# Patient Record
Sex: Female | Born: 1945 | Race: White | Hispanic: No | Marital: Married | Smoking: Former smoker
Health system: Southern US, Community
[De-identification: ages and names within clinical notes are randomized; demographics above are authoritative.]

## PROBLEM LIST (undated history)

## (undated) DIAGNOSIS — I509 Heart failure, unspecified: Secondary | ICD-10-CM

## (undated) DIAGNOSIS — C4491 Basal cell carcinoma of skin, unspecified: Secondary | ICD-10-CM

## (undated) DIAGNOSIS — D649 Anemia, unspecified: Secondary | ICD-10-CM

## (undated) DIAGNOSIS — I4891 Unspecified atrial fibrillation: Secondary | ICD-10-CM

## (undated) DIAGNOSIS — F0390 Unspecified dementia without behavioral disturbance: Secondary | ICD-10-CM

## (undated) HISTORY — PX: HIP PINNING: SHX1757

---

## 2005-12-12 ENCOUNTER — Inpatient Hospital Stay: Payer: Self-pay | Admitting: Unknown Physician Specialty

## 2005-12-12 ENCOUNTER — Other Ambulatory Visit: Payer: Self-pay

## 2019-04-18 ENCOUNTER — Encounter: Payer: Self-pay | Admitting: Anesthesiology

## 2019-04-18 ENCOUNTER — Inpatient Hospital Stay
Admission: EM | Admit: 2019-04-18 | Discharge: 2019-04-21 | DRG: 291 | Disposition: A | Discharge status: Hospice | Payer: Medicare Other | Attending: Internal Medicine | Admitting: Internal Medicine

## 2019-04-18 ENCOUNTER — Emergency Department: Payer: Medicare Other

## 2019-04-18 ENCOUNTER — Other Ambulatory Visit: Payer: Self-pay

## 2019-04-18 DIAGNOSIS — I480 Paroxysmal atrial fibrillation: Secondary | ICD-10-CM | POA: Diagnosis present

## 2019-04-18 DIAGNOSIS — D509 Iron deficiency anemia, unspecified: Secondary | ICD-10-CM | POA: Diagnosis present

## 2019-04-18 DIAGNOSIS — R778 Other specified abnormalities of plasma proteins: Secondary | ICD-10-CM | POA: Diagnosis not present

## 2019-04-18 DIAGNOSIS — J9602 Acute respiratory failure with hypercapnia: Secondary | ICD-10-CM | POA: Diagnosis present

## 2019-04-18 DIAGNOSIS — J9601 Acute respiratory failure with hypoxia: Secondary | ICD-10-CM | POA: Diagnosis present

## 2019-04-18 DIAGNOSIS — D492 Neoplasm of unspecified behavior of bone, soft tissue, and skin: Secondary | ICD-10-CM | POA: Diagnosis not present

## 2019-04-18 DIAGNOSIS — I509 Heart failure, unspecified: Secondary | ICD-10-CM | POA: Diagnosis not present

## 2019-04-18 DIAGNOSIS — R000 Tachycardia, unspecified: Secondary | ICD-10-CM | POA: Diagnosis present

## 2019-04-18 DIAGNOSIS — Z9981 Dependence on supplemental oxygen: Secondary | ICD-10-CM

## 2019-04-18 DIAGNOSIS — E43 Unspecified severe protein-calorie malnutrition: Secondary | ICD-10-CM | POA: Insufficient documentation

## 2019-04-18 DIAGNOSIS — Z681 Body mass index (BMI) 19 or less, adult: Secondary | ICD-10-CM

## 2019-04-18 DIAGNOSIS — R222 Localized swelling, mass and lump, trunk: Secondary | ICD-10-CM

## 2019-04-18 DIAGNOSIS — R0603 Acute respiratory distress: Secondary | ICD-10-CM

## 2019-04-18 DIAGNOSIS — D649 Anemia, unspecified: Secondary | ICD-10-CM | POA: Diagnosis present

## 2019-04-18 DIAGNOSIS — R0600 Dyspnea, unspecified: Secondary | ICD-10-CM

## 2019-04-18 DIAGNOSIS — J90 Pleural effusion, not elsewhere classified: Secondary | ICD-10-CM

## 2019-04-18 DIAGNOSIS — Z20822 Contact with and (suspected) exposure to covid-19: Secondary | ICD-10-CM | POA: Diagnosis present

## 2019-04-18 DIAGNOSIS — I5021 Acute systolic (congestive) heart failure: Secondary | ICD-10-CM | POA: Diagnosis present

## 2019-04-18 DIAGNOSIS — Z7189 Other specified counseling: Secondary | ICD-10-CM | POA: Diagnosis not present

## 2019-04-18 DIAGNOSIS — I4891 Unspecified atrial fibrillation: Secondary | ICD-10-CM

## 2019-04-18 DIAGNOSIS — I248 Other forms of acute ischemic heart disease: Secondary | ICD-10-CM | POA: Diagnosis present

## 2019-04-18 DIAGNOSIS — Z66 Do not resuscitate: Secondary | ICD-10-CM | POA: Diagnosis present

## 2019-04-18 DIAGNOSIS — R7989 Other specified abnormal findings of blood chemistry: Secondary | ICD-10-CM

## 2019-04-18 DIAGNOSIS — Z515 Encounter for palliative care: Secondary | ICD-10-CM

## 2019-04-18 DIAGNOSIS — Z87891 Personal history of nicotine dependence: Secondary | ICD-10-CM

## 2019-04-18 DIAGNOSIS — E876 Hypokalemia: Secondary | ICD-10-CM | POA: Diagnosis not present

## 2019-04-18 HISTORY — DX: Anemia, unspecified: D64.9

## 2019-04-18 HISTORY — DX: Heart failure, unspecified: I50.9

## 2019-04-18 LAB — CBC WITH DIFFERENTIAL/PLATELET
Abs Immature Granulocytes: 0.09 10*3/uL — ABNORMAL HIGH (ref 0.00–0.07)
Basophils Absolute: 0 10*3/uL (ref 0.0–0.1)
Basophils Relative: 0 %
Eosinophils Absolute: 0 10*3/uL (ref 0.0–0.5)
Eosinophils Relative: 0 %
HCT: 17.3 % — ABNORMAL LOW (ref 36.0–46.0)
Hemoglobin: 4.6 g/dL — CL (ref 12.0–15.0)
Immature Granulocytes: 1 %
Lymphocytes Relative: 7 %
Lymphs Abs: 0.7 10*3/uL (ref 0.7–4.0)
MCH: 17.5 pg — ABNORMAL LOW (ref 26.0–34.0)
MCHC: 26.6 g/dL — ABNORMAL LOW (ref 30.0–36.0)
MCV: 65.8 fL — ABNORMAL LOW (ref 80.0–100.0)
Monocytes Absolute: 0.5 10*3/uL (ref 0.1–1.0)
Monocytes Relative: 6 %
Neutro Abs: 8 10*3/uL — ABNORMAL HIGH (ref 1.7–7.7)
Neutrophils Relative %: 86 %
Platelets: 345 10*3/uL (ref 150–400)
RBC: 2.63 MIL/uL — ABNORMAL LOW (ref 3.87–5.11)
RDW: 18.6 % — ABNORMAL HIGH (ref 11.5–15.5)
Smear Review: NORMAL
WBC: 9.3 10*3/uL (ref 4.0–10.5)
nRBC: 1.9 % — ABNORMAL HIGH (ref 0.0–0.2)

## 2019-04-18 LAB — PROTIME-INR
INR: 1.6 — ABNORMAL HIGH (ref 0.8–1.2)
Prothrombin Time: 18.6 seconds — ABNORMAL HIGH (ref 11.4–15.2)

## 2019-04-18 LAB — COMPREHENSIVE METABOLIC PANEL
ALT: 38 U/L (ref 0–44)
AST: 66 U/L — ABNORMAL HIGH (ref 15–41)
Albumin: 2.9 g/dL — ABNORMAL LOW (ref 3.5–5.0)
Alkaline Phosphatase: 58 U/L (ref 38–126)
Anion gap: 18 — ABNORMAL HIGH (ref 5–15)
BUN: 25 mg/dL — ABNORMAL HIGH (ref 8–23)
CO2: 14 mmol/L — ABNORMAL LOW (ref 22–32)
Calcium: 8.6 mg/dL — ABNORMAL LOW (ref 8.9–10.3)
Chloride: 105 mmol/L (ref 98–111)
Creatinine, Ser: 0.97 mg/dL (ref 0.44–1.00)
GFR calc Af Amer: >60 mL/min (ref >60)
GFR calc non Af Amer: 57 mL/min — ABNORMAL LOW (ref >60)
Glucose, Bld: 139 mg/dL — ABNORMAL HIGH (ref 70–99)
Potassium: 3.9 mmol/L (ref 3.5–5.1)
Sodium: 137 mmol/L (ref 135–145)
Total Bilirubin: 1 mg/dL (ref 0.3–1.2)
Total Protein: 7.6 g/dL (ref 6.5–8.1)

## 2019-04-18 LAB — FERRITIN: Ferritin: 5 ng/mL — ABNORMAL LOW (ref 11–307)

## 2019-04-18 LAB — PREPARE RBC (CROSSMATCH)

## 2019-04-18 LAB — RESPIRATORY PANEL BY RT PCR (FLU A&B, COVID)
Influenza A by PCR: NEGATIVE
Influenza B by PCR: NEGATIVE
SARS Coronavirus 2 by RT PCR: NEGATIVE

## 2019-04-18 LAB — IRON AND TIBC
Iron: 10 ug/dL — ABNORMAL LOW (ref 28–170)
Saturation Ratios: 2 % — ABNORMAL LOW (ref 10.4–31.8)
TIBC: 430 ug/dL (ref 250–450)
UIBC: 420 ug/dL

## 2019-04-18 LAB — PROCALCITONIN: Procalcitonin: <0.1 ng/mL

## 2019-04-18 LAB — BRAIN NATRIURETIC PEPTIDE: B Natriuretic Peptide: 1431 pg/mL — ABNORMAL HIGH (ref 0.0–100.0)

## 2019-04-18 LAB — TSH: TSH: 1.744 u[IU]/mL (ref 0.350–4.500)

## 2019-04-18 LAB — POC SARS CORONAVIRUS 2 AG: SARS Coronavirus 2 Ag: NEGATIVE

## 2019-04-18 LAB — MAGNESIUM: Magnesium: 2.2 mg/dL (ref 1.7–2.4)

## 2019-04-18 LAB — VITAMIN B12: Vitamin B-12: 497 pg/mL (ref 180–914)

## 2019-04-18 LAB — ABO/RH: ABO/RH(D): A POS

## 2019-04-18 MED ORDER — CEFAZOLIN (ANCEF) 1 G IV SOLR
1.0000 g | INTRAVENOUS | Status: DC
  Filled 2019-04-18: qty 1

## 2019-04-18 MED ORDER — SODIUM CHLORIDE 0.9% FLUSH
3.0000 mL | Freq: Two times a day (BID) | INTRAVENOUS | Status: DC
  Administered 2019-04-18 – 2019-04-21 (×6): 3 mL via INTRAVENOUS

## 2019-04-18 MED ORDER — SODIUM CHLORIDE 0.9% FLUSH: 3.0000 mL | INTRAVENOUS | Status: DC | PRN

## 2019-04-18 MED ORDER — ONDANSETRON HCL 4 MG/2ML IJ SOLN: 4.0000 mg | Freq: Four times a day (QID) | INTRAMUSCULAR | Status: DC | PRN

## 2019-04-18 MED ORDER — FUROSEMIDE 10 MG/ML IJ SOLN
20.0000 mg | Freq: Once | INTRAMUSCULAR | Status: AC
  Administered 2019-04-18: 20 mg via INTRAVENOUS
  Filled 2019-04-18: qty 4

## 2019-04-18 MED ORDER — FUROSEMIDE 10 MG/ML IJ SOLN: 20.0000 mg | Freq: Two times a day (BID) | INTRAMUSCULAR | Status: DC

## 2019-04-18 MED ORDER — FUROSEMIDE 10 MG/ML IJ SOLN
INTRAMUSCULAR | Status: AC
  Administered 2019-04-18: 20 mg via INTRAVENOUS
  Filled 2019-04-18: qty 2

## 2019-04-18 MED ORDER — IOHEXOL 350 MG/ML SOLN
75.0000 mL | Freq: Once | INTRAVENOUS | Status: AC | PRN
  Administered 2019-04-18: 75 mL via INTRAVENOUS

## 2019-04-18 MED ORDER — SODIUM CHLORIDE 0.9 % IV SOLN: 250.0000 mL | INTRAVENOUS | Status: DC | PRN

## 2019-04-18 MED ORDER — ACETAMINOPHEN 325 MG PO TABS
650.0000 mg | ORAL_TABLET | ORAL | Status: DC | PRN
  Administered 2019-04-20: 650 mg via ORAL

## 2019-04-18 MED ORDER — SODIUM CHLORIDE 0.9 % IV SOLN
10.0000 mL/h | Freq: Once | INTRAVENOUS | Status: AC
  Administered 2019-04-18: 10 mL/h via INTRAVENOUS

## 2019-04-18 MED ORDER — CEFAZOLIN SODIUM-DEXTROSE 1-4 GM/50ML-% IV SOLN: 1.0000 g | INTRAVENOUS | Status: AC

## 2019-04-18 NOTE — ED Provider Notes (Signed)
[facility] Emergency Department Provider Note  ____________________________________________  Time seen: Approximately 3:21 PM  I have reviewed the triage vital signs and the nursing notes.   HISTORY  Chief Complaint Shortness of Breath    HPI Kimberly Cantu is a 74 y.o. female with no known past medical history who comes the ED complaining of shortness of breath, gradual onset for the past week.  Noted to have a room air oxygen saturation of 79% by EMS.  Patient denies chest pain body aches fevers chills or sweats or sick contacts.  Symptoms are constant, no aggravating or alleviating factors.      History reviewed. No pertinent past medical history.   Patient Active Problem List   Diagnosis Date Noted  . Severe anemia 04/18/2019     History reviewed. No pertinent surgical history.   Prior to Admission medications   Not on File  None   Allergies Patient has no allergy information on record.   No family history on file.  Social History Social History   Tobacco Use  . Smoking status: Former Research scientist (life sciences)  . Smokeless tobacco: Never Used  Substance Use Topics  . Alcohol use: Not on file  . Drug use: Not on file    Review of Systems  Constitutional:   No fever or chills.  ENT:   No sore throat. No rhinorrhea. Cardiovascular:   No chest pain or syncope. Respiratory: Positive shortness of breath as above without cough. Gastrointestinal:   Negative for abdominal pain, vomiting and diarrhea.  Musculoskeletal:   Negative for focal pain or swelling All other systems reviewed and are negative except as documented above in ROS and HPI.  ____________________________________________   PHYSICAL EXAM:  VITAL SIGNS: ED Triage Vitals  Enc Vitals Group     BP 04/18/19 1300 (!) 128/55     Pulse Rate 04/18/19 1300 (!) 105     Resp 04/18/19 1300 (!) 25     Temp 04/18/19 1443 97.7 F (36.5 C)     Temp Source 04/18/19 1443 Oral     SpO2  04/18/19 1300 99 %     Weight 04/18/19 1245 115 lb (52.2 kg)     Height 04/18/19 1245 5\' 3"  (1.6 m)     Head Circumference --      Peak Flow --      Pain Score --      Pain Loc --      Pain Edu? --      Excl. in [place]? --     Vital signs reviewed, nursing assessments reviewed.   Constitutional:   Alert and oriented.  Ill-appearing.  Very pale and emaciated Eyes:   Conjunctivae are pale. EOMI. PERRL. ENT      Head:   Normocephalic and atraumatic.      Nose:   Wearing a mask.      Mouth/Throat:   Wearing a mask.      Neck:   No meningismus. Full ROM. Hematological/Lymphatic/Immunilogical:   No cervical lymphadenopathy. Cardiovascular:   Tachycardia heart rate 105. Symmetric bilateral radial and DP pulses.  No murmurs. Cap refill less than 2 seconds. Respiratory:   Tachypnea, diminished breath sounds bilateral bases, right greater than left Gastrointestinal:   Soft and nontender. Non distended. There is no CVA tenderness.  No rebound, rigidity, or guarding.  Musculoskeletal:   Normal range of motion in all extremities. No joint effusions.  No lower extremity tenderness.  No edema.  Right upper chest wall has a 3  x 5 cm lobulated soft tissue mass with prominent vasculature concerning for malignancy Neurologic:   Normal speech and language.  Motor grossly intact. No acute focal neurologic deficits are appreciated.  Skin:    Skin is warm, dry and intact. No rash noted.  No petechiae, purpura, or bullae.  ____________________________________________    LABS (pertinent positives/negatives) (all labs ordered are listed, but only abnormal results are displayed) Labs Reviewed  COMPREHENSIVE METABOLIC PANEL - Abnormal; Notable for the following components:      Result Value   CO2 14 (*)    Glucose, Bld 139 (*)    BUN 25 (*)    Calcium 8.6 (*)    Albumin 2.9 (*)    AST 66 (*)    GFR calc non Af Amer 57 (*)    Anion gap 18 (*)    All other components within normal limits  BRAIN  NATRIURETIC PEPTIDE - Abnormal; Notable for the following components:   B Natriuretic Peptide 1,431.0 (*)    All other components within normal limits  CBC WITH DIFFERENTIAL/PLATELET - Abnormal; Notable for the following components:   RBC 2.63 (*)    Hemoglobin 4.6 (*)    HCT 17.3 (*)    MCV 65.8 (*)    MCH 17.5 (*)    MCHC 26.6 (*)    RDW 18.6 (*)    nRBC 1.9 (*)    Neutro Abs 8.0 (*)    Abs Immature Granulocytes 0.09 (*)    All other components within normal limits  PROTIME-INR - Abnormal; Notable for the following components:   Prothrombin Time 18.6 (*)    INR 1.6 (*)    All other components within normal limits  RESPIRATORY PANEL BY RT PCR (FLU A&B, COVID)  PROCALCITONIN  POC SARS CORONAVIRUS 2 AG -  ED  POC SARS CORONAVIRUS 2 AG  TYPE AND SCREEN  PREPARE RBC (CROSSMATCH)  ABO/RH   ____________________________________________   EKG  Interpreted by me Sinus tachycardia rate 109, normal axis and intervals.  Poor R wave progression.  Normal ST segments.  Lateral T wave inversions, likely demand ischemia.  ____________________________________________    RADIOLOGY  CT Angio Chest PE W and/or Wo Contrast  Result Date: 04/18/2019 CLINICAL DATA:  Shortness of breath, negative COVID-19 test in the ER today EXAM: CT ANGIOGRAPHY CHEST WITH CONTRAST TECHNIQUE: Multidetector CT imaging of the chest was performed using the standard protocol during bolus administration of intravenous contrast. Multiplanar CT image reconstructions and MIPs were obtained to evaluate the vascular anatomy. CONTRAST:  80mL OMNIPAQUE IOHEXOL 350 MG/ML SOLN IV COMPARISON:  None FINDINGS: Cardiovascular: Atherosclerotic calcifications of thoracic aorta, coronary arteries, and proximal great vessels. Aorta normal caliber. No aneurysm or dissection. Pulmonary arteries adequately opacified and patent. No evidence of pulmonary embolism. Heart unremarkable. No pericardial effusion. Mediastinum/Nodes: Esophagus  unremarkable. Base of cervical region normal appearance. Enlarged precarinal lymph node 13 mm short axis. Probable enlarged subcarinal node 15 mm short axis. Few additional scattered upper normal sized mediastinal lymph nodes. Abnormal soft tissue at base of the RIGHT supraclavicular region, likely superficial lobulated mass, 4.5 x 2.6 x 2.4 cm image 5. Lungs/Pleura: Extensive BILATERAL mixed interstitial and airspace infiltrates which could represent pulmonary edema or multifocal infection. BILATERAL moderate pleural effusions. Mild compressive atelectasis of the posterior lower lobes. No pneumothorax or discrete pulmonary mass. Upper Abdomen: Visualized upper abdomen unremarkable Musculoskeletal: Diffuse osseous demineralization. Superior endplate compression deformity of L1 vertebral body. Review of the MIP images confirms the above findings. IMPRESSION:  No evidence of pulmonary embolism. Scattered atherosclerotic calcifications including coronary arteries. Extensive BILATERAL mixed interstitial and airspace infiltrates question multifocal pneumonia or pulmonary edema. Mildly prominent lymph nodes in mediastinum, nonspecific. BILATERAL moderate-sized pleural effusions. Lobulated superficial soft tissue mass superficially at the inferior RIGHT supraclavicular region, recommend correlation with physical exam to exclude tumor. Aortic Atherosclerosis (ICD10-I70.0). Findings called to Dr. Joni Fears on 04/18/2019 at 1501 hours. Electronically Signed   By: Lavonia Dana M.D.   On: 04/18/2019 15:03   DG Chest Portable 1 View  Result Date: 04/18/2019 CLINICAL DATA:  Shortness of breath. Right upper chest mass. Sudden onset of shortness of breath. EXAM: PORTABLE CHEST 1 VIEW COMPARISON:  12/12/2005 FINDINGS: Artifact overlies the chest. Heart size is normal. There is abnormal interstitial and alveolar pulmonary density in a bat wing configuration suggesting acute interstitial and alveolar edema. Bronchopneumonia could have  this appearance. Possible sub pulmonic pleural effusions. No discernible discrete mass lesion. No acute bone finding. IMPRESSION: Bilateral interstitial and alveolar pulmonary density in a perihilar pattern suggesting acute pulmonary edema. Bronchopneumonia could have this appearance. Suspicion of sub pulmonic pleural fluid. Electronically Signed   By: Nelson Chimes M.D.   On: 04/18/2019 13:16    ____________________________________________   PROCEDURES .Critical Care Performed by: Carrie Mew, MD Authorized by: Carrie Mew, MD   Critical care provider statement:    Critical care time (minutes):  35   Critical care time was exclusive of:  Separately billable procedures and treating other patients   Critical care was necessary to treat or prevent imminent or life-threatening deterioration of the following conditions:  Respiratory failure, shock and cardiac failure   Critical care was time spent personally by me on the following activities:  Development of treatment plan with patient or surrogate, discussions with consultants, evaluation of patient's response to treatment, examination of patient, obtaining history from patient or surrogate, ordering and performing treatments and interventions, ordering and review of laboratory studies, ordering and review of radiographic studies, pulse oximetry, re-evaluation of patient's condition and review of old charts    ____________________________________________  DIFFERENTIAL DIAGNOSIS   Lung cancer, breast cancer, pulmonary realism, pneumonia, COVID-19, pulmonary edema, new onset heart failure, pleural effusion, pneumothorax  CLINICAL IMPRESSION / ASSESSMENT AND PLAN / ED COURSE  Medications ordered in the ED: Medications  0.9 %  sodium chloride infusion (has no administration in time range)  furosemide (LASIX) injection 20 mg (has no administration in time range)  iohexol (OMNIPAQUE) 350 MG/ML injection 75 mL (75 mLs Intravenous  Contrast Given 04/18/19 1433)    Pertinent labs & imaging results that were available during my care of the patient were reviewed by me and considered in my medical decision making (see chart for details).  Ople Ishibashi was evaluated in Emergency Department on 04/18/2019 for the symptoms described in the history of present illness. She was evaluated in the context of the global COVID-19 pandemic, which necessitated consideration that the patient might be at risk for infection with the SARS-CoV-2 virus that causes COVID-19. Institutional protocols and algorithms that pertain to the evaluation of patients at risk for COVID-19 are in a state of rapid change based on information released by regulatory bodies including the CDC and federal and state organizations. These policies and algorithms were followed during the patient's care in the ED.   Patient presents with shortness of breath, hypoxic to 79% on room air, stabilized on nasal cannula oxygen.  Will get labs chest x-ray.  Given the prominent tumor on the chest wall,  will need to obtain a CT scan to rule out PE as well.  Clinical Course as of Apr 18 1519  Wed Apr 18, 2019  1317 Chest x-ray shows hazy infiltrates throughout bilateral lungs, much worse on the right.  Also has a soft tissue mass on the chest wall concerning for undiagnosed malignancy especially given her pronounced pallor.  Follow-up labs, will obtain CT angiogram of the chest to evaluate for PE.   [PS]  1402 Patient consents to blood transfusion.   [PS]    Clinical Course User Index [PS] Carrie Mew, MD     ----------------------------------------- 3:26 PM on 04/18/2019 -----------------------------------------  Discussed with radiology, notes no PE, no evidence of a primary lung or breast cancer.  Large amount of edema, effusion.  Discussed with hospitalist who requests IV Lasix in between units of blood.  They suggest that the severe anemia may have precipitated heart  failure resulting in the respiratory failure.  Rapid POC Covid test negative, 2-hour PCR pending. ____________________________________________   FINAL CLINICAL IMPRESSION(S) / ED DIAGNOSES    Final diagnoses:  Acute heart failure, unspecified heart failure type ([place])  Acute respiratory failure with hypoxia [facility])  Soft tissue tumor     ED Discharge Orders    None      Portions of this note were generated with dragon dictation software. Dictation errors may occur despite best attempts at proofreading.   Carrie Mew, MD 04/18/19 1527

## 2019-04-18 NOTE — Consult Note (Signed)
Reason for Consult: Fungating skin mass Referring Physician: Dr. Mal Misty, hospital medicine.  Kimberly Cantu is an 74 y.o. female.  HPI: She presented to the emergency department today with shortness of breath.  She was found to be profoundly anemic with a hemoglobin of 4.  She also had a room air pulse oximetry level of 79%.  Due to her dyspnea and hypoxia, she underwent a PE protocol CT scan that was negative.  She does have bilateral infiltrates and effusions, however her white blood cell count is normal, as is her pro calcitonin.  The current admitting diagnosis is that of congestive heart failure secondary to profound anemia.  During the course of her evaluation in the emergency department, she was found to have a fungating mass on her right clavicle.  Patient states that this has been present since at least 2007 and she was told, at that time, that it was benign.  She reports that she has not seen a physician in approximately 5 years.  Since 2007, the mass has grown significantly.  Based upon its appearance, general surgery has been consulted to evaluate for biopsy in the setting of potential malignancy.  During the course of my discussion with the patient, she stated that it usually does not look as bad, but that she rolled over on it in her sleep last night.  She states this multiple times during the course of my interview.  She reports that it is nontender and that it has never bled or used the way it is currently.  When I posit that it may be a basal cell carcinoma, she states that her father did have basal cell carcinomas on his face.  She has a second lesion on the apex of her right shoulder and she says that this is what the larger mass looked like, back when it was first identified.  She denies ever having had surgery in the past.  She does give a history of multiple blistering sunburns in her childhood.  Her natural hair color is blonde and she does have blue eyes.  Although she is pale from  anemia, she appears to have skin type I.  She is currently receiving a blood transfusion.  History reviewed. No pertinent past medical history.  History reviewed. No pertinent surgical history.  No family history on file.  Social History:  reports that she has quit smoking. She has never used smokeless tobacco. No history on file for alcohol and drug.  Allergies: No Known Allergies  Medications: I have reviewed the patient's current medications.  Results for orders placed or performed during the hospital encounter of 04/18/19 (from the past 48 hour(s))  Comprehensive metabolic panel     Status: Abnormal   Collection Time: 04/18/19 12:56 PM  Result Value Ref Range   Sodium 137 135 - 145 mmol/L   Potassium 3.9 3.5 - 5.1 mmol/L   Chloride 105 98 - 111 mmol/L   CO2 14 (L) 22 - 32 mmol/L   Glucose, Bld 139 (H) 70 - 99 mg/dL   BUN 25 (H) 8 - 23 mg/dL   Creatinine, Ser 0.97 0.44 - 1.00 mg/dL   Calcium 8.6 (L) 8.9 - 10.3 mg/dL   Total Protein 7.6 6.5 - 8.1 g/dL   Albumin 2.9 (L) 3.5 - 5.0 g/dL   AST 66 (H) 15 - 41 U/L   ALT 38 0 - 44 U/L    Comment: RESULT CONFIRMED BY MANUAL DILUTION SDR/KLW   Alkaline Phosphatase 58 38 - 126  U/L   Total Bilirubin 1.0 0.3 - 1.2 mg/dL   GFR calc non Af Amer 57 (L) >60 mL/min   GFR calc Af Amer >60 >60 mL/min   Anion gap 18 (H) 5 - 15    Comment: Performed at [facility], [address] [place], [place] [address]  Brain natriuretic peptide     Status: Abnormal   Collection Time: 04/18/19 12:56 PM  Result Value Ref Range   B Natriuretic Peptide 1,431.0 (H) 0.0 - 100.0 pg/mL    Comment: Performed at [facility], [address] [place], [place] [address]  CBC with Differential     Status: Abnormal   Collection Time: 04/18/19 12:56 PM  Result Value Ref Range   WBC 9.3 4.0 - 10.5 K/uL   RBC 2.63 (L) 3.87 - 5.11 MIL/uL   Hemoglobin 4.6 (LL) 12.0 - 15.0 g/dL    Comment: Reticulocyte Hemoglobin testing may be clinically  indicated, consider ordering this additional test [id number] THIS CRITICAL RESULT HAS VERIFIED AND BEEN CALLED TO KATIE FERGUSON BY MARISA CARVER ON 01 06 2021 AT 1320, AND HAS BEEN READ BACK.     HCT 17.3 (L) 36.0 - 46.0 %   MCV 65.8 (L) 80.0 - 100.0 fL   MCH 17.5 (L) 26.0 - 34.0 pg   MCHC 26.6 (L) 30.0 - 36.0 g/dL   RDW 18.6 (H) 11.5 - 15.5 %   Platelets 345 150 - 400 K/uL   nRBC 1.9 (H) 0.0 - 0.2 %   Neutrophils Relative % 86 %   Neutro Abs 8.0 (H) 1.7 - 7.7 K/uL   Lymphocytes Relative 7 %   Lymphs Abs 0.7 0.7 - 4.0 K/uL   Monocytes Relative 6 %   Monocytes Absolute 0.5 0.1 - 1.0 K/uL   Eosinophils Relative 0 %   Eosinophils Absolute 0.0 0.0 - 0.5 K/uL   Basophils Relative 0 %   Basophils Absolute 0.0 0.0 - 0.1 K/uL   WBC Morphology MORPHOLOGY UNREMARKABLE    RBC Morphology MIXED FIELD POPULATION    Smear Review Normal platelet morphology    Immature Granulocytes 1 %   Abs Immature Granulocytes 0.09 (H) 0.00 - 0.07 K/uL    Comment: Performed at [facility], [address] [place], [place] [address]  Protime-INR     Status: Abnormal   Collection Time: 04/18/19 12:56 PM  Result Value Ref Range   Prothrombin Time 18.6 (H) 11.4 - 15.2 seconds   INR 1.6 (H) 0.8 - 1.2    Comment: (NOTE) INR goal varies based on device and disease states. Performed at [facility], [address] [place], [place] [address]   Type and screen [place]     Status: None (Preliminary result)   Collection Time: 04/18/19 12:56 PM  Result Value Ref Range   ABO/RH(D) A POS    Antibody Screen NEG    Sample Expiration 04/21/2019,2359    Unit Number [id number]    Blood Component Type RED CELLS,LR    Unit division 00    Status of Unit ISSUED    Transfusion Status OK TO TRANSFUSE    Crossmatch Result      Compatible Performed at [facility] [address] [place], [place] [address]    Unit Number [id number]    Blood Component Type RED  CELLS,LR    Unit division 00    Status of Unit ALLOCATED    Transfusion Status OK TO TRANSFUSE    Crossmatch  Result Compatible   Procalcitonin - Baseline     Status: None   Collection Time: 04/18/19 12:56 PM  Result Value Ref Range   Procalcitonin <0.10 ng/mL    Comment:        Interpretation: PCT (Procalcitonin) <= 0.5 ng/mL: Systemic infection (sepsis) is not likely. Local bacterial infection is possible. (NOTE)       Sepsis PCT Algorithm           Lower Respiratory Tract                                      Infection PCT Algorithm    ----------------------------     ----------------------------         PCT < 0.25 ng/mL                PCT < 0.10 ng/mL         Strongly encourage             Strongly discourage   discontinuation of antibiotics    initiation of antibiotics    ----------------------------     -----------------------------       PCT 0.25 - 0.50 ng/mL            PCT 0.10 - 0.25 ng/mL               OR       >80% decrease in PCT            Discourage initiation of                                            antibiotics      Encourage discontinuation           of antibiotics    ----------------------------     -----------------------------         PCT >= 0.50 ng/mL              PCT 0.26 - 0.50 ng/mL               AND        <80% decrease in PCT             Encourage initiation of                                             antibiotics       Encourage continuation           of antibiotics    ----------------------------     -----------------------------        PCT >= 0.50 ng/mL                  PCT > 0.50 ng/mL               AND         increase in PCT                  Strongly encourage  initiation of antibiotics    Strongly encourage escalation           of antibiotics                                     -----------------------------                                           PCT <= 0.25 ng/mL                                                  OR                                        > 80% decrease in PCT                                     Discontinue / Do not initiate                                             antibiotics Performed at [facility], [address] [place], [place] [address]   Prepare RBC     Status: None   Collection Time: 04/18/19  1:29 PM  Result Value Ref Range   Order Confirmation      ORDER PROCESSED BY BLOOD BANK Performed at [facility], [address] [place], [place] [address]   ABO/Rh     Status: None   Collection Time: 04/18/19  1:43 PM  Result Value Ref Range   ABO/RH(D)      A POS Performed at [facility], [address] [place], [place] [address]   POC SARS Coronavirus 2 Ag     Status: None   Collection Time: 04/18/19  1:44 PM  Result Value Ref Range   SARS Coronavirus 2 Ag NEGATIVE NEGATIVE    Comment: (NOTE) SARS-CoV-2 antigen NOT DETECTED.  Negative results are presumptive.  Negative results do not preclude SARS-CoV-2 infection and should not be used as the sole basis for treatment or other patient management decisions, including infection  control decisions, particularly in the presence of clinical signs and  symptoms consistent with COVID-19, or in those who have been in contact with the virus.  Negative results must be combined with clinical observations, patient history, and epidemiological information. The expected result is Negative. Fact Sheet for Patients: PodPark.tn Fact Sheet for Healthcare Providers: GiftContent.is This test is not yet approved or cleared by the [place] FDA and  has been authorized for detection and/or diagnosis of SARS-CoV-2 by FDA under an Emergency Use Authorization (EUA).  This EUA will remain in effect (meaning this test can be used) for the duration of  the COVID-19 de claration under Section 564(b)(1) of the Act, 21 U.S.C. section  360bbb-3(b)(1), unless the authorization is terminated or revoked sooner.   Respiratory Panel by RT PCR (  Flu A&B, Covid) - Nasopharyngeal Swab     Status: None   Collection Time: 04/18/19  2:00 PM   Specimen: Nasopharyngeal Swab  Result Value Ref Range   SARS Coronavirus 2 by RT PCR NEGATIVE NEGATIVE    Comment: (NOTE) SARS-CoV-2 target nucleic acids are NOT DETECTED. The SARS-CoV-2 RNA is generally detectable in upper respiratoy specimens during the acute phase of infection. The lowest concentration of SARS-CoV-2 viral copies this assay can detect is 131 copies/mL. A negative result does not preclude SARS-Cov-2 infection and should not be used as the sole basis for treatment or other patient management decisions. A negative result may occur with  improper specimen collection/handling, submission of specimen other than nasopharyngeal swab, presence of viral mutation(s) within the areas targeted by this assay, and inadequate number of viral copies (<131 copies/mL). A negative result must be combined with clinical observations, patient history, and epidemiological information. The expected result is Negative. Fact Sheet for Patients:  PinkCheek.be Fact Sheet for Healthcare Providers:  GravelBags.it This test is not yet ap proved or cleared by the [place] FDA and  has been authorized for detection and/or diagnosis of SARS-CoV-2 by FDA under an Emergency Use Authorization (EUA). This EUA will remain  in effect (meaning this test can be used) for the duration of the COVID-19 declaration under Section 564(b)(1) of the Act, 21 U.S.C. section 360bbb-3(b)(1), unless the authorization is terminated or revoked sooner.    Influenza A by PCR NEGATIVE NEGATIVE   Influenza B by PCR NEGATIVE NEGATIVE    Comment: (NOTE) The Xpert Xpress SARS-CoV-2/FLU/RSV assay is intended as an aid in  the diagnosis of influenza from Nasopharyngeal  swab specimens and  should not be used as a sole basis for treatment. Nasal washings and  aspirates are unacceptable for Xpert Xpress SARS-CoV-2/FLU/RSV  testing. Fact Sheet for Patients: PinkCheek.be Fact Sheet for Healthcare Providers: GravelBags.it This test is not yet approved or cleared by the [place] FDA and  has been authorized for detection and/or diagnosis of SARS-CoV-2 by  FDA under an Emergency Use Authorization (EUA). This EUA will remain  in effect (meaning this test can be used) for the duration of the  Covid-19 declaration under Section 564(b)(1) of the Act, 21  U.S.C. section 360bbb-3(b)(1), unless the authorization is  terminated or revoked. Performed at [facility], [place], [place] [address]     CT Angio Chest PE W and/or Wo Contrast  Result Date: 04/18/2019 CLINICAL DATA:  Shortness of breath, negative COVID-19 test in the ER today EXAM: CT ANGIOGRAPHY CHEST WITH CONTRAST TECHNIQUE: Multidetector CT imaging of the chest was performed using the standard protocol during bolus administration of intravenous contrast. Multiplanar CT image reconstructions and MIPs were obtained to evaluate the vascular anatomy. CONTRAST:  36mL OMNIPAQUE IOHEXOL 350 MG/ML SOLN IV COMPARISON:  None FINDINGS: Cardiovascular: Atherosclerotic calcifications of thoracic aorta, coronary arteries, and proximal great vessels. Aorta normal caliber. No aneurysm or dissection. Pulmonary arteries adequately opacified and patent. No evidence of pulmonary embolism. Heart unremarkable. No pericardial effusion. Mediastinum/Nodes: Esophagus unremarkable. Base of cervical region normal appearance. Enlarged precarinal lymph node 13 mm short axis. Probable enlarged subcarinal node 15 mm short axis. Few additional scattered upper normal sized mediastinal lymph nodes. Abnormal soft tissue at base of the RIGHT supraclavicular region,  likely superficial lobulated mass, 4.5 x 2.6 x 2.4 cm image 5. Lungs/Pleura: Extensive BILATERAL mixed interstitial and airspace infiltrates which could represent pulmonary edema or multifocal infection. BILATERAL moderate pleural  effusions. Mild compressive atelectasis of the posterior lower lobes. No pneumothorax or discrete pulmonary mass. Upper Abdomen: Visualized upper abdomen unremarkable Musculoskeletal: Diffuse osseous demineralization. Superior endplate compression deformity of L1 vertebral body. Review of the MIP images confirms the above findings. IMPRESSION: No evidence of pulmonary embolism. Scattered atherosclerotic calcifications including coronary arteries. Extensive BILATERAL mixed interstitial and airspace infiltrates question multifocal pneumonia or pulmonary edema. Mildly prominent lymph nodes in mediastinum, nonspecific. BILATERAL moderate-sized pleural effusions. Lobulated superficial soft tissue mass superficially at the inferior RIGHT supraclavicular region, recommend correlation with physical exam to exclude tumor. Aortic Atherosclerosis (ICD10-I70.0). Findings called to Dr. Joni Fears on 04/18/2019 at 1501 hours. Electronically Signed   By: Lavonia Dana M.D.   On: 04/18/2019 15:03   DG Chest Portable 1 View  Result Date: 04/18/2019 CLINICAL DATA:  Shortness of breath. Right upper chest mass. Sudden onset of shortness of breath. EXAM: PORTABLE CHEST 1 VIEW COMPARISON:  12/12/2005 FINDINGS: Artifact overlies the chest. Heart size is normal. There is abnormal interstitial and alveolar pulmonary density in a bat wing configuration suggesting acute interstitial and alveolar edema. Bronchopneumonia could have this appearance. Possible sub pulmonic pleural effusions. No discernible discrete mass lesion. No acute bone finding. IMPRESSION: Bilateral interstitial and alveolar pulmonary density in a perihilar pattern suggesting acute pulmonary edema. Bronchopneumonia could have this appearance.  Suspicion of sub pulmonic pleural fluid. Electronically Signed   By: Nelson Chimes M.D.   On: 04/18/2019 13:16    Review of Systems  All other systems reviewed and are negative. Blood pressure (!) 117/58, pulse 98, temperature 97.8 F (36.6 C), temperature source Oral, resp. rate (!) 22, height 5\' 3"  (1.6 m), weight 52.2 kg, SpO2 100 %. Physical Exam  Constitutional: She is oriented to person, place, and time. She appears well-developed. No distress.  HENT:  Head: Normocephalic and atraumatic.  Mouth and nose are covered with a mask, secondary to COVID-19 precautions  Eyes: Right eye exhibits no discharge. Left eye exhibits no discharge.  Pale conjunctivae  Neck: No tracheal deviation present.  Cardiovascular: Normal rate and regular rhythm.  Respiratory: Effort normal. No stridor. No respiratory distress.  On supplemental oxygen  GI: Soft.  Genitourinary:    Genitourinary Comments: Deferred   Musculoskeletal:        General: No deformity or edema.  Neurological: She is alert and oriented to person, place, and time.  Skin: Lesion noted.     There is a large, fungating mass abutting the right clavicle.  It is fixed and nonmobile.  It is nontender.  It appears quite vascular.  There are pearly edges present.  The lesion appears to have split and is oozing serosanguineous fluid.  There is no odor or any purulent drainage identified.  There is a second small lesion just lateral, on the patient's shoulder.  It has a scab-like appearance.  Psychiatric: She has a normal mood and affect.        Assessment/Plan: This is a 74 year old woman who presented to the hospital with dyspnea.  She was found to be profoundly anemic and the working diagnosis of the admitting hospitalist service is of congestive heart failure secondary to anemia.  During the course of her evaluation, she was found to have a fungating mass on her right shoulder.  CT scan of the chest did not demonstrate any pulmonary  lesions or any breast lesions to suggest either of these etiologies as a primary tumor.  On my physical examination, it appears consistent with a basal cell carcinoma  with ulceration.  Tomorrow, I plan to take her to the operating room for an incisional biopsy.  Rather than doing this bedside, I would prefer to do it in the operating room so that I have multiple options available for hemostasis.  I do not think the entire lesion can be removed without a more complex tissue rearrangement or skin graft.  The patient is extremely thin and the lesion is closely approximated to her clavicle.  I think, that if we get a diagnosis then further management can be pursued on an outpatient basis.  I discussed the risks of the operation with her.  These include, but are not limited to, bleeding, infection, nondiagnostic sample, need for additional surgery and/or treatment.  She is willing to accept these risks and we will proceed to the operating room tomorrow.  Fredirick Maudlin 04/18/2019, 4:50 PM

## 2019-04-18 NOTE — Anesthesia Preprocedure Evaluation (Addendum)
Anesthesia Evaluation    Airway        Dental   Pulmonary former smoker,           Cardiovascular +CHF       Neuro/Psych Dementia    GI/Hepatic   Endo/Other    Renal/GU      Musculoskeletal   Abdominal   Peds  Hematology  (+) Blood dyscrasia, anemia ,   Anesthesia Other Findings CHF due to severe anemia.  Patient being transfused today.  Reproductive/Obstetrics                          Anesthesia Physical Anesthesia Plan  ASA: III  Anesthesia Plan: MAC   Post-op Pain Management:    Induction: Intravenous  PONV Risk Score and Plan:   Airway Management Planned:   Additional Equipment:   Intra-op Plan:   Post-operative Plan:   Informed Consent:   Plan Discussed with:   Anesthesia Plan Comments:         Anesthesia Quick Evaluation

## 2019-04-18 NOTE — Progress Notes (Signed)
RN SPOKE TO DR Leone Haven . MD ORDERED CBC AT 0500 IN AM PRE-OP

## 2019-04-18 NOTE — ED Triage Notes (Addendum)
Pt here via [facility] from home. Per EMS pt was ambulating to the toilet today when she became suddenly short of breath, denies history of the same.   EMS O2 sats were 79% RA, 96% when given breathing treatment. Current O2 sats are 96% on 3L [place]. EMS states wheezes and rales present throughout.  Ems VS- 158/64, 42 HR, 26 RR, 22 etCO2.  Pt also has large area of soft tissue present on right collar bone. She states it has been present since 2007 when the doctors told her it was benign. Area is draining fluid at this time.

## 2019-04-18 NOTE — ED Notes (Signed)
Pt signed paper copy of consent for blood. Consent placed in pt cubby.

## 2019-04-18 NOTE — ED Notes (Signed)
Patient transported to CT 

## 2019-04-18 NOTE — ED Notes (Signed)
Report given to Karen RN

## 2019-04-18 NOTE — H&P (Addendum)
History and Physical:    Kimberly Cantu   [id number] DOB: 01-20-1946 DOA: 04/18/2019  Referring MD/provider: Brenton Grills, MD PCP: Patient, No Pcp Per   Patient coming from: Home  Chief Complaint: Shortness of breath  History of Present Illness:   Kimberly Cantu is a 74 y.o. female with no significant medical history who presented to the hospital with shortness of breath and generalized weakness.  Patient is unable to provide an adequate history and she still over the place with her story.  She said that last night she knew something was not right but she could not place a finger on it.  Earlier this morning, she said she felt poorly and so she called her daughter and she was brought to the emergency room for further evaluation.  She thinks she has been short of breath for some time but she is unable to provide details.  She also said people have been telling her that she looked pale for some weeks now but she never paid attention to it.  She has a mass on her right upper chest wall and she thinks that is been there for over a year and has progressively worsened.  She said that normally it does not bleed but yesterday she noticed that it bled a little when she laid on it.  She is not sure what it is but she thinks it could be cancer because her had basal cell cancer for a long time.  She does not have any cough, chest pain, dizziness, palpitations, vomiting, abdominal pain, diarrhea, bloody stools.   ED Course:  The patient was mildly tachypneic and tachycardic and was hypoxemic with oxygen saturation of 79% on room air.  She was also found to have severe anemia with a hemoglobin of 4.6, elevated BNP of 1,431.  Chest x-ray showed bilateral interstitial and alveolar infiltrates.  CTA did not show PE but there was evidence of bilateral moderate-sized pleural effusions extensive bilateral mixed interstitial and airspace infiltrates and lobulated superficial soft tissue mass superficially  at the inferior right supraclavicular region.   ROS:   ROS all other systems reviewed were negative  Past Medical History:   History reviewed. No pertinent past medical history.  Past Surgical History:   History reviewed. No pertinent surgical history.  Social History:   Social History   Socioeconomic History  . Marital status: Married    Spouse name: Not on file  . Number of children: Not on file  . Years of education: Not on file  . Highest education level: Not on file  Occupational History  . Not on file  Tobacco Use  . Smoking status: Former Research scientist (life sciences)  . Smokeless tobacco: Never Used  Substance and Sexual Activity  . Alcohol use: Not on file  . Drug use: Not on file  . Sexual activity: Not on file  Other Topics Concern  . Not on file  Social History Narrative  . Not on file   Social Determinants of Health   Financial Resource Strain:   . Difficulty of Paying Living Expenses: Not on file  Food Insecurity:   . Worried About Charity fundraiser in the Last Year: Not on file  . Ran Out of Food in the Last Year: Not on file  Transportation Needs:   . Lack of Transportation (Medical): Not on file  . Lack of Transportation (Non-Medical): Not on file  Physical Activity:   . Days of Exercise per Week: Not on file  . Minutes  of Exercise per Session: Not on file  Stress:   . Feeling of Stress : Not on file  Social Connections:   . Frequency of Communication with Friends and Family: Not on file  . Frequency of Social Gatherings with Friends and Family: Not on file  . Attends Religious Services: Not on file  . Active Member of Clubs or Organizations: Not on file  . Attends Archivist Meetings: Not on file  . Marital Status: Not on file  Intimate Partner Violence:   . Fear of Current or Ex-Partner: Not on file  . Emotionally Abused: Not on file  . Physically Abused: Not on file  . Sexually Abused: Not on file    Allergies   Patient has no known  allergies.  Family history:   No family history on file.  Current Medications:   Prior to Admission medications   Not on File    Physical Exam:   Vitals:   04/18/19 1315 04/18/19 1330 04/18/19 1443 04/18/19 1503  BP:  116/62 (!) 123/59 (!) 117/58  Pulse: (!) 101 99 (!) 104 98  Resp: (!) 26 20 (!) 24 (!) 22  Temp:   97.7 F (36.5 C) 97.8 F (36.6 C)  TempSrc:   Oral Oral  SpO2: 95% 97% 100% 100%  Weight:      Height:         Physical Exam: Blood pressure (!) 117/58, pulse 98, temperature 97.8 F (36.6 C), temperature source Oral, resp. rate (!) 22, height 5\' 3"  (1.6 m), weight 52.2 kg, SpO2 100 %. Gen: No acute distress. Head: Normocephalic, atraumatic. Eyes: Pupils equal, round and reactive to light. Extraocular movements intact.  Sclerae nonicteric.  Mouth: Moist mucous membranes Neck: Supple, no thyromegaly, no lymphadenopathy, no jugular venous distention. Chest: Lungs are clear to auscultation with good air movement. No rales, rhonchi or wheezes.  CV: Heart sounds are regular with an S1, S2. No murmurs, rubs, clicks, or gallops.  Abdomen: Soft, nontender, nondistended with normal active bowel sounds. No hepatosplenomegaly or palpable masses. Extremities: Extremities are without clubbing, or cyanosis. No edema. Pedal pulses 2+.  Skin: Warm and dry.  Fungating mass on right upper chest wall/clavicular area Neuro: Alert and oriented times 3; grossly nonfocal.  Psych: Mood and affect normal.         Data Review:    Labs: Basic Metabolic Panel: Recent Labs  Lab 04/18/19 1256  NA 137  K 3.9  CL 105  CO2 14*  GLUCOSE 139*  BUN 25*  CREATININE 0.97  CALCIUM 8.6*   Liver Function Tests: Recent Labs  Lab 04/18/19 1256  AST 66*  ALT 38  ALKPHOS 58  BILITOT 1.0  PROT 7.6  ALBUMIN 2.9*   No results for input(s): LIPASE, AMYLASE in the last 168 hours. No results for input(s): AMMONIA in the last 168 hours. CBC: Recent Labs  Lab 04/18/19 1256   WBC 9.3  NEUTROABS 8.0*  HGB 4.6*  HCT 17.3*  MCV 65.8*  PLT 345   Cardiac Enzymes: No results for input(s): CKTOTAL, CKMB, CKMBINDEX, TROPONINI in the last 168 hours.  BNP (last 3 results) No results for input(s): PROBNP in the last 8760 hours. CBG: No results for input(s): GLUCAP in the last 168 hours.  Urinalysis No results found for: COLORURINE, APPEARANCEUR, LABSPEC, PHURINE, GLUCOSEU, HGBUR, BILIRUBINUR, KETONESUR, PROTEINUR, UROBILINOGEN, NITRITE, LEUKOCYTESUR    Radiographic Studies: CT Angio Chest PE W and/or Wo Contrast  Result Date: 04/18/2019 CLINICAL DATA:  Shortness of  breath, negative COVID-19 test in the ER today EXAM: CT ANGIOGRAPHY CHEST WITH CONTRAST TECHNIQUE: Multidetector CT imaging of the chest was performed using the standard protocol during bolus administration of intravenous contrast. Multiplanar CT image reconstructions and MIPs were obtained to evaluate the vascular anatomy. CONTRAST:  35mL OMNIPAQUE IOHEXOL 350 MG/ML SOLN IV COMPARISON:  None FINDINGS: Cardiovascular: Atherosclerotic calcifications of thoracic aorta, coronary arteries, and proximal great vessels. Aorta normal caliber. No aneurysm or dissection. Pulmonary arteries adequately opacified and patent. No evidence of pulmonary embolism. Heart unremarkable. No pericardial effusion. Mediastinum/Nodes: Esophagus unremarkable. Base of cervical region normal appearance. Enlarged precarinal lymph node 13 mm short axis. Probable enlarged subcarinal node 15 mm short axis. Few additional scattered upper normal sized mediastinal lymph nodes. Abnormal soft tissue at base of the RIGHT supraclavicular region, likely superficial lobulated mass, 4.5 x 2.6 x 2.4 cm image 5. Lungs/Pleura: Extensive BILATERAL mixed interstitial and airspace infiltrates which could represent pulmonary edema or multifocal infection. BILATERAL moderate pleural effusions. Mild compressive atelectasis of the posterior lower lobes. No  pneumothorax or discrete pulmonary mass. Upper Abdomen: Visualized upper abdomen unremarkable Musculoskeletal: Diffuse osseous demineralization. Superior endplate compression deformity of L1 vertebral body. Review of the MIP images confirms the above findings. IMPRESSION: No evidence of pulmonary embolism. Scattered atherosclerotic calcifications including coronary arteries. Extensive BILATERAL mixed interstitial and airspace infiltrates question multifocal pneumonia or pulmonary edema. Mildly prominent lymph nodes in mediastinum, nonspecific. BILATERAL moderate-sized pleural effusions. Lobulated superficial soft tissue mass superficially at the inferior RIGHT supraclavicular region, recommend correlation with physical exam to exclude tumor. Aortic Atherosclerosis (ICD10-I70.0). Findings called to Dr. Joni Fears on 04/18/2019 at 1501 hours. Electronically Signed   By: Lavonia Dana M.D.   On: 04/18/2019 15:03   DG Chest Portable 1 View  Result Date: 04/18/2019 CLINICAL DATA:  Shortness of breath. Right upper chest mass. Sudden onset of shortness of breath. EXAM: PORTABLE CHEST 1 VIEW COMPARISON:  12/12/2005 FINDINGS: Artifact overlies the chest. Heart size is normal. There is abnormal interstitial and alveolar pulmonary density in a bat wing configuration suggesting acute interstitial and alveolar edema. Bronchopneumonia could have this appearance. Possible sub pulmonic pleural effusions. No discernible discrete mass lesion. No acute bone finding. IMPRESSION: Bilateral interstitial and alveolar pulmonary density in a perihilar pattern suggesting acute pulmonary edema. Bronchopneumonia could have this appearance. Suspicion of sub pulmonic pleural fluid. Electronically Signed   By: Nelson Chimes M.D.   On: 04/18/2019 13:16    EKG: Independently reviewed.  Normal sinus tachycardia, T wave inversions in inferior leads and V4 to V6   Assessment/Plan:   Active Problems:   Severe anemia   Body mass index is  20.37 kg/m.   Severe anemia: Admit to telemetry.  Transfuse with 2 units of packed red blood cells.  Monitor H&H.  Check iron studies and vitamin B12 level.  Acute hypoxemic respiratory failure: Continue oxygen via nasal cannula and taper off as able.  Acute heart failure probably from severe anemia: Treat with IV Lasix.  Monitor urine output, BMP and daily weights.  Obtain 2D echo for further evaluation.  Procalcitonin is normal, she has no fever and WBC is normal.  Doubt pneumonia at this time.  Bilateral moderate pleural effusions: This is likely from CHF.  Treat with IV Lasix for now.  Thoracentesis will be considered if pleural effusions persist despite adequate diuresis.  Fungating mass on right upper chest wall around clavicular area: Consulted general surgeon, Dr. Celine Ahr, for biopsy.    Other information:   DVT  prophylaxis: SCDs Code Status: Full code. Family Communication: Plan discussed with the patient  Disposition Plan: Possible discharge to home in 2 to 3 days Consults called: Education officer, environmental, Dr. Celine Ahr Admission status: Inpatient   The medical decision making on this patient was of high complexity and the patient is at high risk for clinical deterioration, therefore this is a level 3 visit.    Time spent 55 minutes  [facility]   How to contact the [facility] Attending or Consulting provider [place] or covering provider during after hours [place], for this patient?   1. Check the care team in [facility] and look for a) attending/consulting [facility] provider listed and b) the [facility] team listed 2. Log into [email or web address] and use [place]'s universal password to access. If you do not have the password, please contact the hospital operator. 3. Locate the [facility] provider you are looking for under [facility] and page to a number that you can be directly reached. 4. If you still have difficulty reaching the provider, please page the [facility] (Director on Call) for the  Hospitalists listed on amion for assistance.  04/18/2019, 3:51 PM

## 2019-04-19 ENCOUNTER — Encounter
Admission: EM | Disposition: A | Discharge status: Hospice | Payer: Self-pay | Source: Home / Self Care | Attending: Internal Medicine

## 2019-04-19 ENCOUNTER — Inpatient Hospital Stay: Payer: Medicare Other

## 2019-04-19 ENCOUNTER — Inpatient Hospital Stay
Admit: 2019-04-19 | Discharge: 2019-04-19 | Disposition: A | Discharge status: Home / Self Care | Payer: Medicare Other | Attending: Internal Medicine | Admitting: Internal Medicine

## 2019-04-19 ENCOUNTER — Encounter: Payer: Self-pay | Admitting: Internal Medicine

## 2019-04-19 DIAGNOSIS — R222 Localized swelling, mass and lump, trunk: Secondary | ICD-10-CM

## 2019-04-19 DIAGNOSIS — E43 Unspecified severe protein-calorie malnutrition: Secondary | ICD-10-CM | POA: Insufficient documentation

## 2019-04-19 DIAGNOSIS — D509 Iron deficiency anemia, unspecified: Secondary | ICD-10-CM

## 2019-04-19 DIAGNOSIS — R0603 Acute respiratory distress: Secondary | ICD-10-CM

## 2019-04-19 DIAGNOSIS — I5021 Acute systolic (congestive) heart failure: Secondary | ICD-10-CM

## 2019-04-19 DIAGNOSIS — J90 Pleural effusion, not elsewhere classified: Secondary | ICD-10-CM

## 2019-04-19 DIAGNOSIS — J9602 Acute respiratory failure with hypercapnia: Secondary | ICD-10-CM

## 2019-04-19 DIAGNOSIS — J9601 Acute respiratory failure with hypoxia: Secondary | ICD-10-CM | POA: Diagnosis present

## 2019-04-19 LAB — BPAM RBC
Blood Product Expiration Date: 202101232359
Blood Product Expiration Date: 202101262359
ISSUE DATE / TIME: 202101061440
ISSUE DATE / TIME: 202101061913
Unit Type and Rh: 6200
Unit Type and Rh: 6200

## 2019-04-19 LAB — CBC WITH DIFFERENTIAL/PLATELET
Abs Immature Granulocytes: 0.11 10*3/uL — ABNORMAL HIGH (ref 0.00–0.07)
Basophils Absolute: 0 10*3/uL (ref 0.0–0.1)
Basophils Relative: 0 %
Eosinophils Absolute: 0 10*3/uL (ref 0.0–0.5)
Eosinophils Relative: 0 %
HCT: 26 % — ABNORMAL LOW (ref 36.0–46.0)
Hemoglobin: 7.9 g/dL — ABNORMAL LOW (ref 12.0–15.0)
Immature Granulocytes: 1 %
Lymphocytes Relative: 7 %
Lymphs Abs: 0.9 10*3/uL (ref 0.7–4.0)
MCH: 20.8 pg — ABNORMAL LOW (ref 26.0–34.0)
MCHC: 30.4 g/dL (ref 30.0–36.0)
MCV: 68.4 fL — ABNORMAL LOW (ref 80.0–100.0)
Monocytes Absolute: 0.6 10*3/uL (ref 0.1–1.0)
Monocytes Relative: 4 %
Neutro Abs: 11.5 10*3/uL — ABNORMAL HIGH (ref 1.7–7.7)
Neutrophils Relative %: 88 %
Platelets: 274 10*3/uL (ref 150–400)
RBC: 3.8 MIL/uL — ABNORMAL LOW (ref 3.87–5.11)
RDW: 21.9 % — ABNORMAL HIGH (ref 11.5–15.5)
Smear Review: NORMAL
WBC: 13.1 10*3/uL — ABNORMAL HIGH (ref 4.0–10.5)
nRBC: 2 % — ABNORMAL HIGH (ref 0.0–0.2)

## 2019-04-19 LAB — BASIC METABOLIC PANEL
Anion gap: 9 (ref 5–15)
Anion gap: 11 (ref 5–15)
BUN: 33 mg/dL — ABNORMAL HIGH (ref 8–23)
BUN: 30 mg/dL — ABNORMAL HIGH (ref 8–23)
CO2: 23 mmol/L (ref 22–32)
CO2: 25 mmol/L (ref 22–32)
Calcium: 8.4 mg/dL — ABNORMAL LOW (ref 8.9–10.3)
Calcium: 8.2 mg/dL — ABNORMAL LOW (ref 8.9–10.3)
Chloride: 103 mmol/L (ref 98–111)
Chloride: 101 mmol/L (ref 98–111)
Creatinine, Ser: 0.79 mg/dL (ref 0.44–1.00)
Creatinine, Ser: 0.71 mg/dL (ref 0.44–1.00)
GFR calc Af Amer: >60 mL/min (ref >60)
GFR calc Af Amer: >60 mL/min (ref >60)
GFR calc non Af Amer: >60 mL/min (ref >60)
GFR calc non Af Amer: >60 mL/min (ref >60)
Glucose, Bld: 146 mg/dL — ABNORMAL HIGH (ref 70–99)
Glucose, Bld: 145 mg/dL — ABNORMAL HIGH (ref 70–99)
Potassium: 3.6 mmol/L (ref 3.5–5.1)
Potassium: 2.6 mmol/L — CL (ref 3.5–5.1)
Sodium: 135 mmol/L (ref 135–145)
Sodium: 137 mmol/L (ref 135–145)

## 2019-04-19 LAB — TYPE AND SCREEN
ABO/RH(D): A POS
Antibody Screen: NEGATIVE
Unit division: 0
Unit division: 0

## 2019-04-19 LAB — MAGNESIUM: Magnesium: 2.1 mg/dL (ref 1.7–2.4)

## 2019-04-19 LAB — TSH: TSH: 1.517 u[IU]/mL (ref 0.350–4.500)

## 2019-04-19 LAB — CBC
HCT: 27.5 % — ABNORMAL LOW (ref 36.0–46.0)
Hemoglobin: 8.5 g/dL — ABNORMAL LOW (ref 12.0–15.0)
MCH: 20.9 pg — ABNORMAL LOW (ref 26.0–34.0)
MCHC: 30.9 g/dL (ref 30.0–36.0)
MCV: 67.7 fL — ABNORMAL LOW (ref 80.0–100.0)
Platelets: 250 10*3/uL (ref 150–400)
RBC: 4.06 MIL/uL (ref 3.87–5.11)
RDW: 22.6 % — ABNORMAL HIGH (ref 11.5–15.5)
WBC: 14.8 10*3/uL — ABNORMAL HIGH (ref 4.0–10.5)
nRBC: 1.8 % — ABNORMAL HIGH (ref 0.0–0.2)

## 2019-04-19 LAB — ECHOCARDIOGRAM COMPLETE
Height: 63 in
Weight: 1888 oz

## 2019-04-19 LAB — HEMOGLOBIN A1C
Hgb A1c MFr Bld: 6 % — ABNORMAL HIGH (ref 4.8–5.6)
Mean Plasma Glucose: 125.5 mg/dL

## 2019-04-19 LAB — LIPID PANEL
Cholesterol: 120 mg/dL (ref 0–200)
HDL: 43 mg/dL (ref >40)
LDL Cholesterol: 69 mg/dL (ref 0–99)
Total CHOL/HDL Ratio: 2.8 RATIO
Triglycerides: 41 mg/dL (ref <150)
VLDL: 8 mg/dL (ref 0–40)

## 2019-04-19 LAB — TROPONIN I (HIGH SENSITIVITY): Troponin I (High Sensitivity): 1395 ng/L (ref <18)

## 2019-04-19 SURGERY — EXCISION MASS
Anesthesia: Monitor Anesthesia Care | Laterality: Right

## 2019-04-19 MED ORDER — FUROSEMIDE 10 MG/ML IJ SOLN
INTRAMUSCULAR | Status: AC
  Filled 2019-04-19: qty 4

## 2019-04-19 MED ORDER — BUPIVACAINE HCL (PF) 0.25 % IJ SOLN
INTRAMUSCULAR | Status: AC
  Filled 2019-04-19: qty 30

## 2019-04-19 MED ORDER — METOPROLOL TARTRATE 5 MG/5ML IV SOLN
5.0000 mg | Freq: Once | INTRAVENOUS | Status: AC
  Administered 2019-04-19: 5 mg via INTRAVENOUS
  Filled 2019-04-19: qty 5

## 2019-04-19 MED ORDER — SUCCINYLCHOLINE CHLORIDE 20 MG/ML IJ SOLN
INTRAMUSCULAR | Status: AC
  Filled 2019-04-19: qty 1

## 2019-04-19 MED ORDER — POLYETHYLENE GLYCOL 3350 17 G PO PACK
17.0000 g | PACK | Freq: Every day | ORAL | Status: DC | PRN
  Filled 2019-04-19: qty 1

## 2019-04-19 MED ORDER — ORAL CARE MOUTH RINSE
15.0000 mL | Freq: Two times a day (BID) | OROMUCOSAL | Status: DC
  Administered 2019-04-19 – 2019-04-21 (×5): 15 mL via OROMUCOSAL

## 2019-04-19 MED ORDER — IPRATROPIUM-ALBUTEROL 0.5-2.5 (3) MG/3ML IN SOLN: 3.0000 mL | RESPIRATORY_TRACT | Status: DC | PRN

## 2019-04-19 MED ORDER — METOPROLOL TARTRATE 25 MG PO TABS
ORAL_TABLET | ORAL | Status: AC
  Filled 2019-04-19: qty 1

## 2019-04-19 MED ORDER — PROPOFOL 10 MG/ML IV BOLUS
INTRAVENOUS | Status: AC
  Filled 2019-04-19: qty 20

## 2019-04-19 MED ORDER — ADULT MULTIVITAMIN W/MINERALS CH
1.0000 | ORAL_TABLET | Freq: Every day | ORAL | Status: DC
  Administered 2019-04-20 – 2019-04-21 (×2): 1 via ORAL
  Filled 2019-04-19 (×2): qty 1

## 2019-04-19 MED ORDER — FUROSEMIDE 10 MG/ML IJ SOLN
INTRAMUSCULAR | Status: AC
  Filled 2019-04-19: qty 2

## 2019-04-19 MED ORDER — SENNOSIDES-DOCUSATE SODIUM 8.6-50 MG PO TABS
2.0000 | ORAL_TABLET | Freq: Every evening | ORAL | Status: DC | PRN
  Administered 2019-04-21: 2 via ORAL
  Filled 2019-04-19 (×2): qty 2

## 2019-04-19 MED ORDER — SODIUM CHLORIDE 0.9 % IV SOLN
100.0000 mg | INTRAVENOUS | Status: DC
  Administered 2019-04-19 – 2019-04-21 (×3): 100 mg via INTRAVENOUS
  Filled 2019-04-19 (×3): qty 5

## 2019-04-19 MED ORDER — LACTATED RINGERS IV SOLN: INTRAVENOUS | Status: DC

## 2019-04-19 MED ORDER — PROPOFOL 500 MG/50ML IV EMUL
INTRAVENOUS | Status: AC
  Filled 2019-04-19: qty 50

## 2019-04-19 MED ORDER — ALPRAZOLAM 0.5 MG PO TABS
0.5000 mg | ORAL_TABLET | Freq: Three times a day (TID) | ORAL | Status: DC | PRN
  Administered 2019-04-19: 0.5 mg via ORAL

## 2019-04-19 MED ORDER — LIDOCAINE-EPINEPHRINE 1 %-1:100000 IJ SOLN
INTRAMUSCULAR | Status: AC
  Filled 2019-04-19: qty 1

## 2019-04-19 MED ORDER — FUROSEMIDE 10 MG/ML IJ SOLN
INTRAMUSCULAR | Status: AC
  Administered 2019-04-19: 40 mg via INTRAVENOUS
  Filled 2019-04-19: qty 4

## 2019-04-19 MED ORDER — NON FORMULARY: 5.0000 mg | Status: DC

## 2019-04-19 MED ORDER — ENSURE ENLIVE PO LIQD
237.0000 mL | Freq: Two times a day (BID) | ORAL | Status: DC
  Administered 2019-04-19 – 2019-04-21 (×4): 237 mL via ORAL

## 2019-04-19 MED ORDER — CEFAZOLIN SODIUM-DEXTROSE 1-4 GM/50ML-% IV SOLN
INTRAVENOUS | Status: AC
  Filled 2019-04-19: qty 50

## 2019-04-19 MED ORDER — FENTANYL CITRATE (PF) 100 MCG/2ML IJ SOLN
INTRAMUSCULAR | Status: AC
  Filled 2019-04-19: qty 2

## 2019-04-19 MED ORDER — METOPROLOL TARTRATE 5 MG/5ML IV SOLN
5.0000 mg | Freq: Once | INTRAVENOUS | Status: AC
  Administered 2019-04-19: 2 mg via INTRAVENOUS
  Filled 2019-04-19: qty 5

## 2019-04-19 MED ORDER — METOPROLOL TARTRATE 25 MG PO TABS
12.5000 mg | ORAL_TABLET | Freq: Once | ORAL | Status: AC
  Administered 2019-04-19: 12.5 mg via ORAL
  Filled 2019-04-19: qty 0.5

## 2019-04-19 MED ORDER — MELATONIN 5 MG PO TABS
5.0000 mg | ORAL_TABLET | ORAL | Status: AC
  Filled 2019-04-19: qty 1

## 2019-04-19 MED ORDER — ALPRAZOLAM 0.5 MG PO TABS
ORAL_TABLET | ORAL | Status: AC
  Filled 2019-04-19: qty 1

## 2019-04-19 MED ORDER — FUROSEMIDE 10 MG/ML IJ SOLN
40.0000 mg | Freq: Two times a day (BID) | INTRAMUSCULAR | Status: DC
  Administered 2019-04-19: 40 mg via INTRAVENOUS

## 2019-04-19 SURGICAL SUPPLY — 30 items
BLADE SURG 15 STRL LF DISP TIS (BLADE) ×1 IMPLANT
BLADE SURG 15 STRL SS (BLADE) ×1
CANISTER SUCT 1200ML W/VALVE (MISCELLANEOUS) ×2 IMPLANT
CHLORAPREP W/TINT 26 (MISCELLANEOUS) ×2 IMPLANT
COVER WAND RF STERILE (DRAPES) ×2 IMPLANT
DECANTER SPIKE VIAL GLASS SM (MISCELLANEOUS) ×4 IMPLANT
DERMABOND ADVANCED (GAUZE/BANDAGES/DRESSINGS) ×1
DERMABOND ADVANCED .7 DNX12 (GAUZE/BANDAGES/DRESSINGS) ×1 IMPLANT
DRAPE LAPAROTOMY 77X122 PED (DRAPES) ×2 IMPLANT
DRAPE MAG INST 16X20 L/F (DRAPES) ×2 IMPLANT
ELECT CAUTERY BLADE TIP 2.5 (TIP) ×2
ELECT REM PT RETURN 9FT ADLT (ELECTROSURGICAL) ×2
ELECTRODE CAUTERY BLDE TIP 2.5 (TIP) ×1 IMPLANT
ELECTRODE REM PT RTRN 9FT ADLT (ELECTROSURGICAL) ×1 IMPLANT
GLOVE BIO SURGEON STRL SZ 6.5 (GLOVE) ×2 IMPLANT
GLOVE INDICATOR 7.0 STRL GRN (GLOVE) ×2 IMPLANT
GOWN STRL REUS W/ TWL LRG LVL3 (GOWN DISPOSABLE) ×2 IMPLANT
GOWN STRL REUS W/TWL LRG LVL3 (GOWN DISPOSABLE) ×2
KIT TURNOVER KIT A (KITS) ×2 IMPLANT
NEEDLE HYPO 25X1 1.5 SAFETY (NEEDLE) ×2 IMPLANT
PACK BASIN MINOR ARMC (MISCELLANEOUS) ×2 IMPLANT
SPONGE LAP 18X18 RF (DISPOSABLE) ×2 IMPLANT
STRIP CLOSURE SKIN 1/2X4 (GAUZE/BANDAGES/DRESSINGS) ×2 IMPLANT
SUT MNCRL 4-0 (SUTURE) ×1
SUT MNCRL 4-0 27XMFL (SUTURE) ×1
SUT VIC AB 3-0 SH 27 (SUTURE) ×1
SUT VIC AB 3-0 SH 27X BRD (SUTURE) ×1 IMPLANT
SUTURE MNCRL 4-0 27XMF (SUTURE) ×1 IMPLANT
SYR 10ML LL (SYRINGE) ×2 IMPLANT
SYR BULB IRRIG 60ML STRL (SYRINGE) ×2 IMPLANT

## 2019-04-19 NOTE — Progress Notes (Signed)
High flow oxygen ordered per MD. RT aware.

## 2019-04-19 NOTE — Consult Note (Signed)
CARDIOLOGY CONSULT NOTE               Patient ID: Kimberly Cantu MRN: [id number] DOB/AGE: 01/08/1946 74 y.o.  Admit date: 04/18/2019 Referring Physician Dr. Gerlean Ren  Primary Physician n/a Primary Cardiologist n/a Reason for Consultation New onset HFrEF   HPI: Kimberly Cantu is a 74 year old female with no significant past medical history who presented to the ED on 04/17/18 for worsening shortness of breath as well as a right upper chest wall mass.  Workup in the ED was significant for oxygen saturation of 79% on room air, BNP of 1431, hemoglobin of 4.6, chest xray revealing pulmonary edema, and CT angio revealing moderate pleural effusions bilaterally with a right lobulated soft tissue mass in the supraclavicular region.  She was transfused 2 units of blood and treated with IV Lasix.  She is scheduled for a biopsy of mass for 04/23/19.   Today, Kimberly Cantu reports significant improvement in shortness of breath and denies chest pain, palpitations, lower extremity swelling, orthopnea, PND, or syncopal/presyncopal episodes.  She denies any prior cardiac history and has not seen a physician in several years.  She reports taking no medications prior to admission.   Review of systems complete and found to be negative unless listed above     Past Medical History:  Diagnosis Date  . Anemia   . CHF (congestive heart failure) ([place])     Past Surgical History:  Procedure Laterality Date  . HIP PINNING Left     No medications prior to admission.   Social History   Socioeconomic History  . Marital status: Married    Spouse name: Not on file  . Number of children: Not on file  . Years of education: Not on file  . Highest education level: Not on file  Occupational History  . Not on file  Tobacco Use  . Smoking status: Former Research scientist (life sciences)  . Smokeless tobacco: Never Used  Substance and Sexual Activity  . Alcohol use: Never  . Drug use: Never  . Sexual activity: Not on file  Other  Topics Concern  . Not on file  Social History Narrative  . Not on file   Social Determinants of Health   Financial Resource Strain:   . Difficulty of Paying Living Expenses: Not on file  Food Insecurity:   . Worried About Charity fundraiser in the Last Year: Not on file  . Ran Out of Food in the Last Year: Not on file  Transportation Needs:   . Lack of Transportation (Medical): Not on file  . Lack of Transportation (Non-Medical): Not on file  Physical Activity:   . Days of Exercise per Week: Not on file  . Minutes of Exercise per Session: Not on file  Stress:   . Feeling of Stress : Not on file  Social Connections:   . Frequency of Communication with Friends and Family: Not on file  . Frequency of Social Gatherings with Friends and Family: Not on file  . Attends Religious Services: Not on file  . Active Member of Clubs or Organizations: Not on file  . Attends Archivist Meetings: Not on file  . Marital Status: Not on file  Intimate Partner Violence:   . Fear of Current or Ex-Partner: Not on file  . Emotionally Abused: Not on file  . Physically Abused: Not on file  . Sexually Abused: Not on file    History reviewed. No pertinent family history.  Review of systems complete and found to be negative unless listed above    PHYSICAL EXAM  General: Well developed, well nourished, in no acute distress HEENT:  Normocephalic and atramatic Neck:  No JVD.  Lungs: Clear bilaterally to auscultation and percussion. Heart: Tachycardic, regular rhythm . Normal S1 and S2 without gallops or murmurs.  Abdomen: Bowel sounds are positive, abdomen soft and non-tender  Msk:  Back normal. Normal strength and tone for age. Extremities: No clubbing, cyanosis or edema.   Neuro: Alert and oriented X 3. Psych:  Good affect, responds appropriately  Labs:   Lab Results  Component Value Date   WBC 13.1 (H) 04/19/2019   HGB 7.9 (L) 04/19/2019   HCT 26.0 (L) 04/19/2019   MCV 68.4  (L) 04/19/2019   PLT 274 04/19/2019    Recent Labs  Lab 04/18/19 1256 04/19/19 0437  NA 137 135  K 3.9 3.6  CL 105 103  CO2 14* 23  BUN 25* 33*  CREATININE 0.97 0.79  CALCIUM 8.6* 8.4*  PROT 7.6  --   BILITOT 1.0  --   ALKPHOS 58  --   ALT 38  --   AST 66*  --   GLUCOSE 139* 146*   No results found for: CKTOTAL, CKMB, CKMBINDEX, TROPONINI No results found for: CHOL No results found for: HDL No results found for: LDLCALC No results found for: TRIG No results found for: CHOLHDL No results found for: LDLDIRECT    Radiology: CT Angio Chest PE W and/or Wo Contrast  Result Date: 04/18/2019 CLINICAL DATA:  Shortness of breath, negative COVID-19 test in the ER today EXAM: CT ANGIOGRAPHY CHEST WITH CONTRAST TECHNIQUE: Multidetector CT imaging of the chest was performed using the standard protocol during bolus administration of intravenous contrast. Multiplanar CT image reconstructions and MIPs were obtained to evaluate the vascular anatomy. CONTRAST:  18mL OMNIPAQUE IOHEXOL 350 MG/ML SOLN IV COMPARISON:  None FINDINGS: Cardiovascular: Atherosclerotic calcifications of thoracic aorta, coronary arteries, and proximal great vessels. Aorta normal caliber. No aneurysm or dissection. Pulmonary arteries adequately opacified and patent. No evidence of pulmonary embolism. Heart unremarkable. No pericardial effusion. Mediastinum/Nodes: Esophagus unremarkable. Base of cervical region normal appearance. Enlarged precarinal lymph node 13 mm short axis. Probable enlarged subcarinal node 15 mm short axis. Few additional scattered upper normal sized mediastinal lymph nodes. Abnormal soft tissue at base of the RIGHT supraclavicular region, likely superficial lobulated mass, 4.5 x 2.6 x 2.4 cm image 5. Lungs/Pleura: Extensive BILATERAL mixed interstitial and airspace infiltrates which could represent pulmonary edema or multifocal infection. BILATERAL moderate pleural effusions. Mild compressive atelectasis of the  posterior lower lobes. No pneumothorax or discrete pulmonary mass. Upper Abdomen: Visualized upper abdomen unremarkable Musculoskeletal: Diffuse osseous demineralization. Superior endplate compression deformity of L1 vertebral body. Review of the MIP images confirms the above findings. IMPRESSION: No evidence of pulmonary embolism. Scattered atherosclerotic calcifications including coronary arteries. Extensive BILATERAL mixed interstitial and airspace infiltrates question multifocal pneumonia or pulmonary edema. Mildly prominent lymph nodes in mediastinum, nonspecific. BILATERAL moderate-sized pleural effusions. Lobulated superficial soft tissue mass superficially at the inferior RIGHT supraclavicular region, recommend correlation with physical exam to exclude tumor. Aortic Atherosclerosis (ICD10-I70.0). Findings called to Dr. Joni Fears on 04/18/2019 at 1501 hours. Electronically Signed   By: Lavonia Dana M.D.   On: 04/18/2019 15:03   DG CHEST PORT 1 VIEW  Result Date: 04/19/2019 CLINICAL DATA:  Dyspnea EXAM: PORTABLE CHEST 1 VIEW COMPARISON:  04/18/2019 FINDINGS: Normal heart size. Small bilateral pleural effusions. Diffuse bilateral  interstitial and airspace densities are again identified and appear unchanged. No atelectasis or pneumothorax. IMPRESSION: 1. No change in aeration to the lungs compared with prior exam. 2. Stable small bilateral pleural effusions. Electronically Signed   By: Kerby Moors M.D.   On: 04/19/2019 09:00   DG Chest Portable 1 View  Result Date: 04/18/2019 CLINICAL DATA:  Shortness of breath. Right upper chest mass. Sudden onset of shortness of breath. EXAM: PORTABLE CHEST 1 VIEW COMPARISON:  12/12/2005 FINDINGS: Artifact overlies the chest. Heart size is normal. There is abnormal interstitial and alveolar pulmonary density in a bat wing configuration suggesting acute interstitial and alveolar edema. Bronchopneumonia could have this appearance. Possible sub pulmonic pleural effusions.  No discernible discrete mass lesion. No acute bone finding. IMPRESSION: Bilateral interstitial and alveolar pulmonary density in a perihilar pattern suggesting acute pulmonary edema. Bronchopneumonia could have this appearance. Suspicion of sub pulmonic pleural fluid. Electronically Signed   By: Nelson Chimes M.D.   On: 04/18/2019 13:16   ECHOCARDIOGRAM COMPLETE  Result Date: 04/19/2019   ECHOCARDIOGRAM REPORT   Patient Name:   Kimberly Cantu Date of Exam: 04/19/2019 Medical Rec #:  [id number]        Height:       63.0 in Accession #:    [id number]       Weight:       118.0 lb Date of Birth:  Nov 20, 1945        BSA:          1.55 m Patient Age:    1 years         BP:           139/75 mmHg Patient Gender: F                HR:           102 bpm. Exam Location:  [facility] Procedure: 2D Echo, Color Doppler and Cardiac Doppler Indications:     Dyspnea 786.09  History:         Patient has no prior history of Echocardiogram examinations.                  CHF.  Sonographer:     Sherrie Sport RDCS (AE) Referring Phys:  [place] Diagnosing Phys: Bartholome Bill MD IMPRESSIONS  1. Left ventricular ejection fraction, by visual estimation, is 25 to 30%. The left ventricle has moderate to severely decreased function. Left ventricular septal wall thickness was normal. There is no left ventricular hypertrophy.  2. Moderately dilated left ventricular internal cavity size.  3. The left ventricle demonstrates global hypokinesis.  4. Global right ventricle has normal systolic function.The right ventricular size is mildly enlarged. No increase in right ventricular wall thickness.  5. Left atrial size was mildly dilated.  6. Right atrial size was mildly dilated.  7. Large pleural effusion in the left lateral region.  8. The mitral valve is grossly normal. Trivial mitral valve regurgitation.  9. The tricuspid valve is grossly normal. 10. The aortic valve was not well visualized. Aortic valve regurgitation is trivial. 11. The pulmonic  valve was not well visualized. Pulmonic valve regurgitation is trivial. 12. The aortic root was not well visualized. FINDINGS  Left Ventricle: Left ventricular ejection fraction, by visual estimation, is 25 to 30%. The left ventricle has moderate to severely decreased function. The left ventricle demonstrates global hypokinesis. The left ventricular internal cavity size was moderately dilated left ventricle. There is no left ventricular hypertrophy. Right Ventricle:  The right ventricular size is mildly enlarged. No increase in right ventricular wall thickness. Global RV systolic function is has normal systolic function. Left Atrium: Left atrial size was mildly dilated. Right Atrium: Right atrial size was mildly dilated Pericardium: There is no evidence of pericardial effusion. There is a large pleural effusion in the left lateral region. Mitral Valve: The mitral valve is grossly normal. Trivial mitral valve regurgitation. Tricuspid Valve: The tricuspid valve is grossly normal. Tricuspid valve regurgitation is mild. Aortic Valve: The aortic valve was not well visualized. Aortic valve regurgitation is trivial. Aortic valve mean gradient measures 2.5 mmHg. Aortic valve peak gradient measures 5.3 mmHg. Aortic valve area, by VTI measures 1.82 cm. Pulmonic Valve: The pulmonic valve was not well visualized. Pulmonic valve regurgitation is trivial. Pulmonic regurgitation is trivial. Aorta: The aortic root was not well visualized. IAS/Shunts: No atrial level shunt detected by color flow Doppler.  LEFT VENTRICLE PLAX 2D LVIDd:         4.86 cm       Diastology LVIDs:         4.17 cm       LV e' lateral: 5.66 cm/s LV PW:         0.91 cm       LV e' medial:  4.13 cm/s LV IVS:        1.18 cm LVOT diam:     1.90 cm LV SV:         33 ml LV SV Index:   21.66 LVOT Area:     2.84 cm  LV Volumes (MOD) LV area d, A2C:    43.00 cm LV area d, A4C:    34.10 cm LV area s, A2C:    35.80 cm LV area s, A4C:    29.50 cm LV major d, A2C:    8.95 cm LV major d, A4C:   7.31 cm LV major s, A2C:   8.52 cm LV major s, A4C:   7.45 cm LV vol d, MOD A2C: 171.0 ml LV vol d, MOD A4C: 128.0 ml LV vol s, MOD A2C: 124.0 ml LV vol s, MOD A4C: 93.6 ml LV SV MOD A2C:     47.0 ml LV SV MOD A4C:     128.0 ml LV SV MOD BP:      48.2 ml RIGHT VENTRICLE TAPSE (M-mode): 2.7 cm LEFT ATRIUM             Index       RIGHT ATRIUM           Index LA diam:        3.40 cm 2.20 cm/m  RA Area:     12.00 cm LA Vol (A2C):   71.5 ml 46.27 ml/m RA Volume:   31.30 ml  20.26 ml/m LA Vol (A4C):   34.9 ml 22.59 ml/m LA Biplane Vol: 52.4 ml 33.91 ml/m  AORTIC VALVE                   PULMONIC VALVE AV Area (Vmax):    1.98 cm    PV Vmax:        0.62 m/s AV Area (Vmean):   2.04 cm    PV Peak grad:   1.5 mmHg AV Area (VTI):     1.82 cm    RVOT Peak grad: 2 mmHg AV Vmax:           115.50 cm/s AV Vmean:  67.200 cm/s AV VTI:            0.160 m AV Peak Grad:      5.3 mmHg AV Mean Grad:      2.5 mmHg LVOT Vmax:         80.50 cm/s LVOT Vmean:        48.400 cm/s LVOT VTI:          0.103 m LVOT/AV VTI ratio: 0.64  AORTA Ao Root diam: 2.70 cm  SHUNTS Systemic VTI:  0.10 m Systemic Diam: 1.90 cm  Bartholome Bill MD Electronically signed by Bartholome Bill MD Signature Date/Time: 04/19/2019/10:49:28 AM    Final     EKG: Sinus tachycardia; non-specific ST/T wave changes   ASSESSMENT AND PLAN:  1.  Acute HFrEF  -Echo revealing an EF of 25%-30% with global hypokinesis   -Continue to diuresis with Lasix 40mg  BID; continue daily weights, I's and O's   -As patient remains chest pain free, will defer ischemic workup to an outpatient setting as she is not a candidate for left heart catheterization with severe anemia   -Prior to discharge, will add BB, ACE/ARB with further medication management in an outpatient setting  2.  Right chest wall fungal mass  -Patient is okay to proceed with biopsy from a cardiac standpoint     The history, physical exam findings, and plan of care were all  discussed with Dr. Bartholome Bill, and all decision making was made in collaboration.   Signed: Avie Arenas PA-C 04/19/2019, 11:08 AM

## 2019-04-19 NOTE — Consult Note (Addendum)
Kimberly Antigua, MD [address] [place], [place], [place], [address] [id number], [place], [place], [place], [address] Phone: [phone]  Fax: [phone]  Consultation  Referring Provider:     Dr. Reesa Chew Primary Care Physician:  Patient, No Pcp Per Reason for Consultation:     Iron deficiency anemia  Date of Admission:  04/18/2019 Date of Consultation:  04/19/2019         HPI:   Kimberly Cantu is a 74 y.o. female presented with shortness of breath and generalized weakness, and a right shoulder mass that is awaiting work-up, with GI being consulted for anemia.  Patient denies any episodes of bleeding.  She specifically states she looks at her stool frequently and has not noticed any red blood or black stools.  Denies any epistaxis, hematuria etc.  Imaging showed bilateral pleural effusions, reduced EF of 25%, patient required oxygen supplementation.  Past Medical History:  Diagnosis Date  . Anemia   . CHF (congestive heart failure) ([place])     Past Surgical History:  Procedure Laterality Date  . HIP PINNING Left     Prior to Admission medications   Not on File    History reviewed. No pertinent family history.   Social History   Tobacco Use  . Smoking status: Former Research scientist (life sciences)  . Smokeless tobacco: Never Used  Substance Use Topics  . Alcohol use: Never  . Drug use: Never    Allergies as of 04/18/2019  . (No Known Allergies)    Review of Systems:    All systems reviewed and negative except where noted in HPI.   Physical Exam:  Vital signs in last 24 hours: Vitals:   04/19/19 0215 04/19/19 0315 04/19/19 0500 04/19/19 0632  BP: (!) 143/75 137/81  139/75  Pulse: (!) 101 (!) 107  (!) 102  Resp: 20 20  20   Temp: 98 F (36.7 C) 98.1 F (36.7 C)  98 F (36.7 C)  TempSrc:    Temporal  SpO2: 91% 92%  95%  Weight:   50.6 kg 53.5 kg  Height:    5\' 3"  (1.6 m)   Last BM Date: 04/19/19 General:   Pleasant, cooperative in NAD Head:  Normocephalic and  atraumatic. Eyes:   No icterus.   Conjunctiva pink. PERRLA. Ears:  Normal auditory acuity. Neck:  Supple; no masses or thyroidomegaly Lungs: Respirations even and unlabored. Lungs clear to auscultation bilaterally.   No wheezes, crackles, or rhonchi.  Abdomen:  Soft, nondistended, nontender. Normal bowel sounds. No appreciable masses or hepatomegaly.  No rebound or guarding.  Neurologic:  Alert and oriented;  grossly normal neurologically. Skin:  Intact without significant lesions or rashes. Cervical Nodes:  No significant cervical adenopathy. Psych:  Alert and cooperative. Normal affect.  LAB RESULTS: Recent Labs    04/18/19 1256 04/19/19 0437  WBC 9.3 13.1*  HGB 4.6* 7.9*  HCT 17.3* 26.0*  PLT 345 274   BMET Recent Labs    04/18/19 1256 04/19/19 0437  NA 137 135  K 3.9 3.6  CL 105 103  CO2 14* 23  GLUCOSE 139* 146*  BUN 25* 33*  CREATININE 0.97 0.79  CALCIUM 8.6* 8.4*   LFT Recent Labs    04/18/19 1256  PROT 7.6  ALBUMIN 2.9*  AST 66*  ALT 38  ALKPHOS 58  BILITOT 1.0   PT/INR Recent Labs    04/18/19 1256  LABPROT 18.6*  INR 1.6*    STUDIES: CT Angio Chest PE W and/or Wo  Contrast  Result Date: 04/18/2019 CLINICAL DATA:  Shortness of breath, negative COVID-19 test in the ER today EXAM: CT ANGIOGRAPHY CHEST WITH CONTRAST TECHNIQUE: Multidetector CT imaging of the chest was performed using the standard protocol during bolus administration of intravenous contrast. Multiplanar CT image reconstructions and MIPs were obtained to evaluate the vascular anatomy. CONTRAST:  45mL OMNIPAQUE IOHEXOL 350 MG/ML SOLN IV COMPARISON:  None FINDINGS: Cardiovascular: Atherosclerotic calcifications of thoracic aorta, coronary arteries, and proximal great vessels. Aorta normal caliber. No aneurysm or dissection. Pulmonary arteries adequately opacified and patent. No evidence of pulmonary embolism. Heart unremarkable. No pericardial effusion. Mediastinum/Nodes: Esophagus  unremarkable. Base of cervical region normal appearance. Enlarged precarinal lymph node 13 mm short axis. Probable enlarged subcarinal node 15 mm short axis. Few additional scattered upper normal sized mediastinal lymph nodes. Abnormal soft tissue at base of the RIGHT supraclavicular region, likely superficial lobulated mass, 4.5 x 2.6 x 2.4 cm image 5. Lungs/Pleura: Extensive BILATERAL mixed interstitial and airspace infiltrates which could represent pulmonary edema or multifocal infection. BILATERAL moderate pleural effusions. Mild compressive atelectasis of the posterior lower lobes. No pneumothorax or discrete pulmonary mass. Upper Abdomen: Visualized upper abdomen unremarkable Musculoskeletal: Diffuse osseous demineralization. Superior endplate compression deformity of L1 vertebral body. Review of the MIP images confirms the above findings. IMPRESSION: No evidence of pulmonary embolism. Scattered atherosclerotic calcifications including coronary arteries. Extensive BILATERAL mixed interstitial and airspace infiltrates question multifocal pneumonia or pulmonary edema. Mildly prominent lymph nodes in mediastinum, nonspecific. BILATERAL moderate-sized pleural effusions. Lobulated superficial soft tissue mass superficially at the inferior RIGHT supraclavicular region, recommend correlation with physical exam to exclude tumor. Aortic Atherosclerosis (ICD10-I70.0). Findings called to Dr. Joni Fears on 04/18/2019 at 1501 hours. Electronically Signed   By: Lavonia Dana M.D.   On: 04/18/2019 15:03   DG CHEST PORT 1 VIEW  Result Date: 04/19/2019 CLINICAL DATA:  Dyspnea EXAM: PORTABLE CHEST 1 VIEW COMPARISON:  04/18/2019 FINDINGS: Normal heart size. Small bilateral pleural effusions. Diffuse bilateral interstitial and airspace densities are again identified and appear unchanged. No atelectasis or pneumothorax. IMPRESSION: 1. No change in aeration to the lungs compared with prior exam. 2. Stable small bilateral pleural  effusions. Electronically Signed   By: Kerby Moors M.D.   On: 04/19/2019 09:00   DG Chest Portable 1 View  Result Date: 04/18/2019 CLINICAL DATA:  Shortness of breath. Right upper chest mass. Sudden onset of shortness of breath. EXAM: PORTABLE CHEST 1 VIEW COMPARISON:  12/12/2005 FINDINGS: Artifact overlies the chest. Heart size is normal. There is abnormal interstitial and alveolar pulmonary density in a bat wing configuration suggesting acute interstitial and alveolar edema. Bronchopneumonia could have this appearance. Possible sub pulmonic pleural effusions. No discernible discrete mass lesion. No acute bone finding. IMPRESSION: Bilateral interstitial and alveolar pulmonary density in a perihilar pattern suggesting acute pulmonary edema. Bronchopneumonia could have this appearance. Suspicion of sub pulmonic pleural fluid. Electronically Signed   By: Nelson Chimes M.D.   On: 04/18/2019 13:16   ECHOCARDIOGRAM COMPLETE  Result Date: 04/19/2019   ECHOCARDIOGRAM REPORT   Patient Name:   [facility] Date of Exam: 04/19/2019 Medical Rec #:  [id number]        Height:       63.0 in Accession #:    [id number]       Weight:       118.0 lb Date of Birth:  1946-01-04        BSA:          1.55 m Patient Age:  73 years         BP:           139/75 mmHg Patient Gender: F                HR:           102 bpm. Exam Location:  [facility] Procedure: 2D Echo, Color Doppler and Cardiac Doppler Indications:     Dyspnea 786.09  History:         Patient has no prior history of Echocardiogram examinations.                  CHF.  Sonographer:     Sherrie Sport RDCS (AE) Referring Phys:  [place] Diagnosing Phys: Bartholome Bill MD IMPRESSIONS  1. Left ventricular ejection fraction, by visual estimation, is 25 to 30%. The left ventricle has moderate to severely decreased function. Left ventricular septal wall thickness was normal. There is no left ventricular hypertrophy.  2. Moderately dilated left ventricular internal cavity  size.  3. The left ventricle demonstrates global hypokinesis.  4. Global right ventricle has normal systolic function.The right ventricular size is mildly enlarged. No increase in right ventricular wall thickness.  5. Left atrial size was mildly dilated.  6. Right atrial size was mildly dilated.  7. Large pleural effusion in the left lateral region.  8. The mitral valve is grossly normal. Trivial mitral valve regurgitation.  9. The tricuspid valve is grossly normal. 10. The aortic valve was not well visualized. Aortic valve regurgitation is trivial. 11. The pulmonic valve was not well visualized. Pulmonic valve regurgitation is trivial. 12. The aortic root was not well visualized. FINDINGS  Left Ventricle: Left ventricular ejection fraction, by visual estimation, is 25 to 30%. The left ventricle has moderate to severely decreased function. The left ventricle demonstrates global hypokinesis. The left ventricular internal cavity size was moderately dilated left ventricle. There is no left ventricular hypertrophy. Right Ventricle: The right ventricular size is mildly enlarged. No increase in right ventricular wall thickness. Global RV systolic function is has normal systolic function. Left Atrium: Left atrial size was mildly dilated. Right Atrium: Right atrial size was mildly dilated Pericardium: There is no evidence of pericardial effusion. There is a large pleural effusion in the left lateral region. Mitral Valve: The mitral valve is grossly normal. Trivial mitral valve regurgitation. Tricuspid Valve: The tricuspid valve is grossly normal. Tricuspid valve regurgitation is mild. Aortic Valve: The aortic valve was not well visualized. Aortic valve regurgitation is trivial. Aortic valve mean gradient measures 2.5 mmHg. Aortic valve peak gradient measures 5.3 mmHg. Aortic valve area, by VTI measures 1.82 cm. Pulmonic Valve: The pulmonic valve was not well visualized. Pulmonic valve regurgitation is trivial. Pulmonic  regurgitation is trivial. Aorta: The aortic root was not well visualized. IAS/Shunts: No atrial level shunt detected by color flow Doppler.  LEFT VENTRICLE PLAX 2D LVIDd:         4.86 cm       Diastology LVIDs:         4.17 cm       LV e' lateral: 5.66 cm/s LV PW:         0.91 cm       LV e' medial:  4.13 cm/s LV IVS:        1.18 cm LVOT diam:     1.90 cm LV SV:         33 ml LV SV Index:   21.66 LVOT Area:  2.84 cm  LV Volumes (MOD) LV area d, A2C:    43.00 cm LV area d, A4C:    34.10 cm LV area s, A2C:    35.80 cm LV area s, A4C:    29.50 cm LV major d, A2C:   8.95 cm LV major d, A4C:   7.31 cm LV major s, A2C:   8.52 cm LV major s, A4C:   7.45 cm LV vol d, MOD A2C: 171.0 ml LV vol d, MOD A4C: 128.0 ml LV vol s, MOD A2C: 124.0 ml LV vol s, MOD A4C: 93.6 ml LV SV MOD A2C:     47.0 ml LV SV MOD A4C:     128.0 ml LV SV MOD BP:      48.2 ml RIGHT VENTRICLE TAPSE (M-mode): 2.7 cm LEFT ATRIUM             Index       RIGHT ATRIUM           Index LA diam:        3.40 cm 2.20 cm/m  RA Area:     12.00 cm LA Vol (A2C):   71.5 ml 46.27 ml/m RA Volume:   31.30 ml  20.26 ml/m LA Vol (A4C):   34.9 ml 22.59 ml/m LA Biplane Vol: 52.4 ml 33.91 ml/m  AORTIC VALVE                   PULMONIC VALVE AV Area (Vmax):    1.98 cm    PV Vmax:        0.62 m/s AV Area (Vmean):   2.04 cm    PV Peak grad:   1.5 mmHg AV Area (VTI):     1.82 cm    RVOT Peak grad: 2 mmHg AV Vmax:           115.50 cm/s AV Vmean:          67.200 cm/s AV VTI:            0.160 m AV Peak Grad:      5.3 mmHg AV Mean Grad:      2.5 mmHg LVOT Vmax:         80.50 cm/s LVOT Vmean:        48.400 cm/s LVOT VTI:          0.103 m LVOT/AV VTI ratio: 0.64  AORTA Ao Root diam: 2.70 cm  SHUNTS Systemic VTI:  0.10 m Systemic Diam: 1.90 cm  Bartholome Bill MD Electronically signed by Bartholome Bill MD Signature Date/Time: 04/19/2019/10:49:28 AM    Final       Impression / Plan:   Shereka Kovanda is a 74 y.o. y/o female with admission for shortness of breath, pleural  effusions, requiring oxygen supplementation, right shoulder fungating mass awaiting work-up, with GI consulted for iron deficiency anemia without any sources of active bleeding  Patient denies any previous history of EGD or colonoscopy  With her shortness of breath, and a right shoulder fungating mass, suspicious for malignancy, would recommend further medical optimization and awaiting the results from the work-up of the mass itself prior to any endoscopic procedures, granted that she remains asymptomatic from a GI bleed perspective  In the presence of bilateral pleural effusions, and her low EF, with cardiology consult pending, endoscopic procedures without any active GI bleeding would have higher risks than benefits  If the mass shows malignancy, it might explain her anemia as well  Would recommend iron replacement with  IV iron at this time  Hemoglobin has responded well to packed red blood cell transfusion and has improved since admission  It would also be reasonable to place her on PPI twice daily  Continue serial CBCs and transfuse PRN Avoid NSAIDs Maintain 2 large-bore IV lines Please page GI with any acute hemodynamic changes, or signs of active GI bleeding (none present at this time)  In addition, patient specifically states that she does not think she would want endoscopic procedures    Thank you for involving me in the care of this patient.      LOS: 1 day   Virgel Manifold, MD  04/19/2019, 1:03 PM

## 2019-04-19 NOTE — Progress Notes (Signed)
Pt spoke with anesthesia and Dr. Celine Ahr this am. Patients surgery cancelled and rescheduled for a later date.

## 2019-04-19 NOTE — Progress Notes (Signed)
Purewick changed.

## 2019-04-19 NOTE — Progress Notes (Signed)
Pt c/o continued anxiety. O2 sats continue to drop to mid 80s. Rachael Fee, NP notified.

## 2019-04-19 NOTE — Progress Notes (Signed)
PROGRESS NOTE    Kimberly Cantu  [id number] DOB: February 16, 1946 DOA: 04/18/2019 PCP: Patient, No Pcp Per   Brief Narrative:  74 year old with no significant medical history presenting to the ER with shortness of breath and generalized weakness.  Overall she is a poor historian but started feeling unusual night prior to the admission.  Also reported of right chest wall mass which she noticed had progressively worsened over the course of the past 24 hours.  Noted to be hypoxic initially with severe anemia with hemoglobin of 4.6, elevated BNP of 1400.  Chest x-ray showing bilateral infiltrates.  CTA was negative for PE but showed bilateral moderate-sized pleural effusions.   Assessment & Plan:   Principal Problem:   Acute respiratory failure with hypoxia and hypercapnia (HCC) Active Problems:   Severe anemia   Acute systolic CHF (congestive heart failure) (HCC)   Mass of chest wall, right   Acute respiratory distress   Bilateral pleural effusion  Generalized weakness/symptomatic anemia Severe iron deficiency anemia -Initial hemoglobin 4.6.  Status post 2 units PRBC transfusion -Severe iron deficiency noted, will start IV iron followed by p.o. -Check FOBT -Consulted gastroenterology for further work-up.  No previous history of colonoscopy or endoscopy.  Once stable from respiratory standpoint, she will likely need this   Acute hypoxic respiratory distress-multifactorial, 12 L high flow Bilateral moderate pleural effusions Acute congestive heart failure with reduced ejection fraction, 25%.  Class IV -Echocardiogram -EF  25% -Procalcitonin negative -Bronchodilators, incentive spirometer and flutter valve -Stat chest x-ray -IV Lasix 40 mg twice daily -Consult cardiology for further ischemic work-up as deemed necessary. -Check TSH, lipid panel, A1c  Right chest wall fungating mass -Patient will require incisional biopsy once medically optimized  Moderate protein calorie  malnutrition -Consult nutrition team  DVT prophylaxis: SCDs Code Status: Full code Family Communication: None Disposition Plan: To be determined  Consultants:   Cardiology-[facility]  GI  Procedures:   None  Antimicrobials:   None none none   Subjective: Seen and examined in the preop area, patient was requiring 10 L of high flow nasal cannula.  At rest she appeared okay but with minimal exertion she complained of severe shortness of breath.  She tells me she has lost about 20 pounds of weight over the last year but denies any gross blood loss or dark stools.  Denies any NSAID use.  She also denies any chest pain.  Review of Systems Otherwise negative except as per HPI, including: General: Denies fever, chills, night sweats or unintended weight loss. Resp: Denies hemoptysis Cardiac: Denies chest pain, palpitations, orthopnea, paroxysmal nocturnal dyspnea. GI: Denies abdominal pain, nausea, vomiting, diarrhea or constipation GU: Denies dysuria, frequency, hesitancy or incontinence MS: Denies muscle aches, joint pain or swelling Neuro: Denies headache, neurologic deficits (focal weakness, numbness, tingling), abnormal gait Psych: Denies anxiety, depression, SI/HI/AVH Skin: Denies new rashes or lesions ID: Denies sick contacts, exotic exposures, travel  Objective: Vitals:   04/19/19 0215 04/19/19 0315 04/19/19 0500 04/19/19 0632  BP: (!) 143/75 137/81  139/75  Pulse: (!) 101 (!) 107  (!) 102  Resp: 20 20  20   Temp: 98 F (36.7 C) 98.1 F (36.7 C)  98 F (36.7 C)  TempSrc:    Temporal  SpO2: 91% 92%  95%  Weight:   50.6 kg 53.5 kg  Height:    5\' 3"  (1.6 m)    Intake/Output Summary (Last 24 hours) at 04/19/2019 1058 Last data filed at 04/18/2019 2156 Gross per 24 hour  Intake  717 ml  Output --  Net 717 ml   Filed Weights   04/18/19 1805 04/19/19 0500 04/19/19 [id number]  Weight: 52.2 kg 50.6 kg 53.5 kg    Examination:  General exam: Appears calm at rest but dyspneic with  mild distress with minimal exertion, on 12 L high flow nasal cannula, frail elderly.  Some temporal wasting bilaterally Respiratory system: Bilateral diminished breath sound midway up the lung fields Cardiovascular system: S1 & S2 heard, RRR. No JVD, murmurs, rubs, gallops or clicks. No pedal edema. Gastrointestinal system: Abdomen is nondistended, soft and nontender. No organomegaly or masses felt. Normal bowel sounds heard. Central nervous system: Alert and oriented. No focal neurological deficits. Extremities: Symmetric 5 x 5 power.  Generalized muscle wasting. Skin: Right chest wall fungating mass noted with loose dressing over place Psychiatry: Judgement and insight appear normal. Mood & affect appropriate.     Data Reviewed:   CBC: Recent Labs  Lab 04/18/19 1256 04/19/19 0437  WBC 9.3 13.1*  NEUTROABS 8.0* 11.5*  HGB 4.6* 7.9*  HCT 17.3* 26.0*  MCV 65.8* 68.4*  PLT 345 [id number]   Basic Metabolic Panel: Recent Labs  Lab 04/18/19 1256 04/19/19 0437  NA 137 135  K 3.9 3.6  CL 105 103  CO2 14* 23  GLUCOSE 139* 146*  BUN 25* 33*  CREATININE 0.97 0.79  CALCIUM 8.6* 8.4*  MG 2.2  --    GFR: Estimated Creatinine Clearance: 50.3 mL/min (by C-G formula based on SCr of 0.79 mg/dL). Liver Function Tests: Recent Labs  Lab 04/18/19 1256  AST 66*  ALT 38  ALKPHOS 58  BILITOT 1.0  PROT 7.6  ALBUMIN 2.9*   No results for input(s): LIPASE, AMYLASE in the last 168 hours. No results for input(s): AMMONIA in the last 168 hours. Coagulation Profile: Recent Labs  Lab 04/18/19 1256  INR 1.6*   Cardiac Enzymes: No results for input(s): CKTOTAL, CKMB, CKMBINDEX, TROPONINI in the last 168 hours. BNP (last 3 results) No results for input(s): PROBNP in the last 8760 hours. HbA1C: No results for input(s): HGBA1C in the last 72 hours. CBG: No results for input(s): GLUCAP in the last 168 hours. Lipid Profile: No results for input(s): CHOL, HDL, LDLCALC, TRIG, CHOLHDL, LDLDIRECT  in the last 72 hours. Thyroid Function Tests: Recent Labs    04/18/19 1256  TSH 1.744   Anemia Panel: Recent Labs    04/18/19 1256 04/18/19 1257  VITAMINB12  --  497  FERRITIN 5*  --   TIBC 430  --   IRON 10*  --    Sepsis Labs: Recent Labs  Lab 04/18/19 1256  PROCALCITON <0.10    Recent Results (from the past 240 hour(s))  Respiratory Panel by RT PCR (Flu A&B, Covid) - Nasopharyngeal Swab     Status: None   Collection Time: 04/18/19  2:00 PM   Specimen: Nasopharyngeal Swab  Result Value Ref Range Status   SARS Coronavirus 2 by RT PCR NEGATIVE NEGATIVE Final    Comment: (NOTE) SARS-CoV-2 target nucleic acids are NOT DETECTED. The SARS-CoV-2 RNA is generally detectable in upper respiratoy specimens during the acute phase of infection. The lowest concentration of SARS-CoV-2 viral copies this assay can detect is 131 copies/mL. A negative result does not preclude SARS-Cov-2 infection and should not be used as the sole basis for treatment or other patient management decisions. A negative result may occur with  improper specimen collection/handling, submission of specimen other than nasopharyngeal swab, presence of viral mutation(s) within  the areas targeted by this assay, and inadequate number of viral copies (<131 copies/mL). A negative result must be combined with clinical observations, patient history, and epidemiological information. The expected result is Negative. Fact Sheet for Patients:  PinkCheek.be Fact Sheet for Healthcare Providers:  GravelBags.it This test is not yet ap proved or cleared by the [place] FDA and  has been authorized for detection and/or diagnosis of SARS-CoV-2 by FDA under an Emergency Use Authorization (EUA). This EUA will remain  in effect (meaning this test can be used) for the duration of the COVID-19 declaration under Section 564(b)(1) of the Act, 21 U.S.C. section  360bbb-3(b)(1), unless the authorization is terminated or revoked sooner.    Influenza A by PCR NEGATIVE NEGATIVE Final   Influenza B by PCR NEGATIVE NEGATIVE Final    Comment: (NOTE) The Xpert Xpress SARS-CoV-2/FLU/RSV assay is intended as an aid in  the diagnosis of influenza from Nasopharyngeal swab specimens and  should not be used as a sole basis for treatment. Nasal washings and  aspirates are unacceptable for Xpert Xpress SARS-CoV-2/FLU/RSV  testing. Fact Sheet for Patients: PinkCheek.be Fact Sheet for Healthcare Providers: GravelBags.it This test is not yet approved or cleared by the [place] FDA and  has been authorized for detection and/or diagnosis of SARS-CoV-2 by  FDA under an Emergency Use Authorization (EUA). This EUA will remain  in effect (meaning this test can be used) for the duration of the  Covid-19 declaration under Section 564(b)(1) of the Act, 21  U.S.C. section 360bbb-3(b)(1), unless the authorization is  terminated or revoked. Performed at [facility], [address] [place], [place] [address]          Radiology Studies: CT Angio Chest PE W and/or Wo Contrast  Result Date: 04/18/2019 CLINICAL DATA:  Shortness of breath, negative COVID-19 test in the ER today EXAM: CT ANGIOGRAPHY CHEST WITH CONTRAST TECHNIQUE: Multidetector CT imaging of the chest was performed using the standard protocol during bolus administration of intravenous contrast. Multiplanar CT image reconstructions and MIPs were obtained to evaluate the vascular anatomy. CONTRAST:  6mL OMNIPAQUE IOHEXOL 350 MG/ML SOLN IV COMPARISON:  None FINDINGS: Cardiovascular: Atherosclerotic calcifications of thoracic aorta, coronary arteries, and proximal great vessels. Aorta normal caliber. No aneurysm or dissection. Pulmonary arteries adequately opacified and patent. No evidence of pulmonary embolism. Heart unremarkable. No  pericardial effusion. Mediastinum/Nodes: Esophagus unremarkable. Base of cervical region normal appearance. Enlarged precarinal lymph node 13 mm short axis. Probable enlarged subcarinal node 15 mm short axis. Few additional scattered upper normal sized mediastinal lymph nodes. Abnormal soft tissue at base of the RIGHT supraclavicular region, likely superficial lobulated mass, 4.5 x 2.6 x 2.4 cm image 5. Lungs/Pleura: Extensive BILATERAL mixed interstitial and airspace infiltrates which could represent pulmonary edema or multifocal infection. BILATERAL moderate pleural effusions. Mild compressive atelectasis of the posterior lower lobes. No pneumothorax or discrete pulmonary mass. Upper Abdomen: Visualized upper abdomen unremarkable Musculoskeletal: Diffuse osseous demineralization. Superior endplate compression deformity of L1 vertebral body. Review of the MIP images confirms the above findings. IMPRESSION: No evidence of pulmonary embolism. Scattered atherosclerotic calcifications including coronary arteries. Extensive BILATERAL mixed interstitial and airspace infiltrates question multifocal pneumonia or pulmonary edema. Mildly prominent lymph nodes in mediastinum, nonspecific. BILATERAL moderate-sized pleural effusions. Lobulated superficial soft tissue mass superficially at the inferior RIGHT supraclavicular region, recommend correlation with physical exam to exclude tumor. Aortic Atherosclerosis (ICD10-I70.0). Findings called to Dr. Joni Fears on 04/18/2019 at 1501 hours. Electronically Signed   By: Lavonia Dana  M.D.   On: 04/18/2019 15:03   DG CHEST PORT 1 VIEW  Result Date: 04/19/2019 CLINICAL DATA:  Dyspnea EXAM: PORTABLE CHEST 1 VIEW COMPARISON:  04/18/2019 FINDINGS: Normal heart size. Small bilateral pleural effusions. Diffuse bilateral interstitial and airspace densities are again identified and appear unchanged. No atelectasis or pneumothorax. IMPRESSION: 1. No change in aeration to the lungs compared  with prior exam. 2. Stable small bilateral pleural effusions. Electronically Signed   By: Kerby Moors M.D.   On: 04/19/2019 09:00   DG Chest Portable 1 View  Result Date: 04/18/2019 CLINICAL DATA:  Shortness of breath. Right upper chest mass. Sudden onset of shortness of breath. EXAM: PORTABLE CHEST 1 VIEW COMPARISON:  12/12/2005 FINDINGS: Artifact overlies the chest. Heart size is normal. There is abnormal interstitial and alveolar pulmonary density in a bat wing configuration suggesting acute interstitial and alveolar edema. Bronchopneumonia could have this appearance. Possible sub pulmonic pleural effusions. No discernible discrete mass lesion. No acute bone finding. IMPRESSION: Bilateral interstitial and alveolar pulmonary density in a perihilar pattern suggesting acute pulmonary edema. Bronchopneumonia could have this appearance. Suspicion of sub pulmonic pleural fluid. Electronically Signed   By: Nelson Chimes M.D.   On: 04/18/2019 13:16   ECHOCARDIOGRAM COMPLETE  Result Date: 04/19/2019   ECHOCARDIOGRAM REPORT   Patient Name:   Kimberly Cantu Date of Exam: 04/19/2019 Medical Rec #:  [id number]        Height:       63.0 in Accession #:    [id number]       Weight:       118.0 lb Date of Birth:  01/09/1946        BSA:          1.55 m Patient Age:    80 years         BP:           139/75 mmHg Patient Gender: F                HR:           102 bpm. Exam Location:  [facility] Procedure: 2D Echo, Color Doppler and Cardiac Doppler Indications:     Dyspnea 786.09  History:         Patient has no prior history of Echocardiogram examinations.                  CHF.  Sonographer:     Sherrie Sport RDCS (AE) Referring Phys:  [place] Diagnosing Phys: Bartholome Bill MD IMPRESSIONS  1. Left ventricular ejection fraction, by visual estimation, is 25 to 30%. The left ventricle has moderate to severely decreased function. Left ventricular septal wall thickness was normal. There is no left ventricular hypertrophy.  2.  Moderately dilated left ventricular internal cavity size.  3. The left ventricle demonstrates global hypokinesis.  4. Global right ventricle has normal systolic function.The right ventricular size is mildly enlarged. No increase in right ventricular wall thickness.  5. Left atrial size was mildly dilated.  6. Right atrial size was mildly dilated.  7. Large pleural effusion in the left lateral region.  8. The mitral valve is grossly normal. Trivial mitral valve regurgitation.  9. The tricuspid valve is grossly normal. 10. The aortic valve was not well visualized. Aortic valve regurgitation is trivial. 11. The pulmonic valve was not well visualized. Pulmonic valve regurgitation is trivial. 12. The aortic root was not well visualized. FINDINGS  Left Ventricle: Left ventricular ejection fraction, by visual  estimation, is 25 to 30%. The left ventricle has moderate to severely decreased function. The left ventricle demonstrates global hypokinesis. The left ventricular internal cavity size was moderately dilated left ventricle. There is no left ventricular hypertrophy. Right Ventricle: The right ventricular size is mildly enlarged. No increase in right ventricular wall thickness. Global RV systolic function is has normal systolic function. Left Atrium: Left atrial size was mildly dilated. Right Atrium: Right atrial size was mildly dilated Pericardium: There is no evidence of pericardial effusion. There is a large pleural effusion in the left lateral region. Mitral Valve: The mitral valve is grossly normal. Trivial mitral valve regurgitation. Tricuspid Valve: The tricuspid valve is grossly normal. Tricuspid valve regurgitation is mild. Aortic Valve: The aortic valve was not well visualized. Aortic valve regurgitation is trivial. Aortic valve mean gradient measures 2.5 mmHg. Aortic valve peak gradient measures 5.3 mmHg. Aortic valve area, by VTI measures 1.82 cm. Pulmonic Valve: The pulmonic valve was not well visualized.  Pulmonic valve regurgitation is trivial. Pulmonic regurgitation is trivial. Aorta: The aortic root was not well visualized. IAS/Shunts: No atrial level shunt detected by color flow Doppler.  LEFT VENTRICLE PLAX 2D LVIDd:         4.86 cm       Diastology LVIDs:         4.17 cm       LV e' lateral: 5.66 cm/s LV PW:         0.91 cm       LV e' medial:  4.13 cm/s LV IVS:        1.18 cm LVOT diam:     1.90 cm LV SV:         33 ml LV SV Index:   21.66 LVOT Area:     2.84 cm  LV Volumes (MOD) LV area d, A2C:    43.00 cm LV area d, A4C:    34.10 cm LV area s, A2C:    35.80 cm LV area s, A4C:    29.50 cm LV major d, A2C:   8.95 cm LV major d, A4C:   7.31 cm LV major s, A2C:   8.52 cm LV major s, A4C:   7.45 cm LV vol d, MOD A2C: 171.0 ml LV vol d, MOD A4C: 128.0 ml LV vol s, MOD A2C: 124.0 ml LV vol s, MOD A4C: 93.6 ml LV SV MOD A2C:     47.0 ml LV SV MOD A4C:     128.0 ml LV SV MOD BP:      48.2 ml RIGHT VENTRICLE TAPSE (M-mode): 2.7 cm LEFT ATRIUM             Index       RIGHT ATRIUM           Index LA diam:        3.40 cm 2.20 cm/m  RA Area:     12.00 cm LA Vol (A2C):   71.5 ml 46.27 ml/m RA Volume:   31.30 ml  20.26 ml/m LA Vol (A4C):   34.9 ml 22.59 ml/m LA Biplane Vol: 52.4 ml 33.91 ml/m  AORTIC VALVE                   PULMONIC VALVE AV Area (Vmax):    1.98 cm    PV Vmax:        0.62 m/s AV Area (Vmean):   2.04 cm    PV Peak grad:   1.5 mmHg AV Area (  VTI):     1.82 cm    RVOT Peak grad: 2 mmHg AV Vmax:           115.50 cm/s AV Vmean:          67.200 cm/s AV VTI:            0.160 m AV Peak Grad:      5.3 mmHg AV Mean Grad:      2.5 mmHg LVOT Vmax:         80.50 cm/s LVOT Vmean:        48.400 cm/s LVOT VTI:          0.103 m LVOT/AV VTI ratio: 0.64  AORTA Ao Root diam: 2.70 cm  SHUNTS Systemic VTI:  0.10 m Systemic Diam: 1.90 cm  Bartholome Bill MD Electronically signed by Bartholome Bill MD Signature Date/Time: 04/19/2019/10:49:28 AM    Final         Scheduled Meds: . furosemide      . furosemide      .  furosemide  40 mg Intravenous Q12H  . Melatonin  5 mg Oral NOW  . sodium chloride flush  3 mL Intravenous Q12H   Continuous Infusions: . sodium chloride    . ceFAZolin    .  ceFAZolin (ANCEF) IV    . iron sucrose       LOS: 1 day   Time spent= 45 mins    Chiquita Heckert Arsenio Loader, MD [facility]  If 7PM-7AM, please contact night-coverage  04/19/2019, 10:58 AM

## 2019-04-19 NOTE — Progress Notes (Addendum)
Initial Nutrition Assessment  DOCUMENTATION CODES:   Severe malnutrition in context of chronic illness  INTERVENTION:   Ensure Enlive po BID, each supplement provides 350 kcal and 20 grams of protein  Magic cup TID with meals, each supplement provides 290 kcal and 9 grams of protein  MVI daily   Dysphagia 3 diet  NUTRITION DIAGNOSIS:   Severe Malnutrition related to social / environmental circumstances as evidenced by per patient/family report.  GOAL:   Patient will meet greater than or equal to 90% of their needs  MONITOR:   PO intake, Supplement acceptance, Labs, Weight trends, Skin, I & O's  REASON FOR ASSESSMENT:   Consult Assessment of nutrition requirement/status  ASSESSMENT:   74 y.o. female presented with shortness of breath, generalized weakness, anemia and right shoulder mass.  Met with patient in room today. Pt reports fair appetite and oral intake at home but reports that she eats small meals. Pt does enjoy drinking Ensure supplements but she does not drink them regularly. Pt loves ice cream. Pt reports having some teeth pulled in 8/20 making it difficult for her to chew. RD will add supplements and vitamins to help pt meet her estimated needs. RD will also change pt over to a mechanical soft diet. There is no weight history in chart to determine if any recent significant weight loss.   Medications reviewed and include: lasix, melatonin, cefazolin, iron sucrose  Labs reviewed: BUN 33(H) Wbc- 13.1(H), Hgb 7.9(L), Hct 26.0(L), MCV 68.4(L), MCH 20.8(L) Iron 10(L), TIBC 430, ferritin 5(L)- 1/6 B12- 497- 1/6  NUTRITION - FOCUSED PHYSICAL EXAM:    Most Recent Value  Orbital Region  Moderate depletion  Upper Arm Region  Severe depletion  Thoracic and Lumbar Region  Severe depletion  Buccal Region  Severe depletion  Temple Region  Severe depletion  Clavicle Bone Region  Severe depletion  Clavicle and Acromion Bone Region  Severe depletion  Scapular Bone  Region  Moderate depletion  Dorsal Hand  Severe depletion  Patellar Region  Severe depletion  Anterior Thigh Region  Severe depletion  Posterior Calf Region  Severe depletion  Edema (RD Assessment)  None  Hair  Reviewed  Eyes  Reviewed  Mouth  Reviewed  Skin  Reviewed  Nails  Reviewed     Diet Order:   Diet Order            DIET DYS 3 Room service appropriate? Yes; Fluid consistency: Thin  Diet effective now             DUCATION NEEDS:   Education needs have been addressed  Skin:  Skin Assessment: Reviewed RN Assessment  Last BM:  1/7- type 6  Height:   Ht Readings from Last 1 Encounters:  04/19/19 '5\' 3"'  (1.6 m)    Weight:   Wt Readings from Last 1 Encounters:  04/19/19 53.5 kg    Ideal Body Weight:  52.3 kg  BMI:  Body mass index is 20.9 kg/m.  Estimated Nutritional Needs:   Kcal:  1300-1500kcal/day  Protein:  65-75g/day  Fluid:  >1.3L/day  Koleen Distance MS, RD, LDN Pager #- [phone] Office#- [phone] After Hours Pager: [phone]

## 2019-04-19 NOTE — TOC Initial Note (Signed)
Transition of Care [facility]) - Initial/Assessment Note    Patient Details  Name: Kimberly Cantu MRN: [id number] Date of Birth: 01/01/1946  Transition of Care [facility]) CM/SW Contact:    Anselm Pancoast, RN Phone Number: 04/19/2019, 12:46 PM  Clinical Narrative:                 Patient states she is not interested in SNF placement wants to discharge home with hhc if needed. Has husband and 2 adult daughters who assist her as needed. Patient uses walker at home and was able to complete ADLs independently prior to admission. No problems with obtaining medications or transportation to and from appointments.   Expected Discharge Plan: [place]     Patient Goals and CMS Choice Patient states their goals for this hospitalization and ongoing recovery are:: Get back home and get strong as she was before      Expected Discharge Plan and Services Expected Discharge Plan: [place]       Living arrangements for the past 2 months: Single Family Home                                      Prior Living Arrangements/Services Living arrangements for the past 2 months: Single Family Home Lives with:: Adult Children, Spouse Patient language and need for interpreter reviewed:: Yes Do you feel safe going back to the place where you live?: Yes      Need for Family Participation in Patient Care: Yes (Comment) Care giver support system in place?: Yes (comment) Current home services: DME Criminal Activity/Legal Involvement Pertinent to Current Situation/Hospitalization: No - Comment as needed  Activities of Daily Living      Permission Sought/Granted Permission sought to share information with : Case Manager, Customer service manager Permission granted to share information with : Yes, Verbal Permission Granted              Emotional Assessment Appearance:: Appears stated age Attitude/Demeanor/Rapport: Ambitious, Engaged Affect (typically  observed): Accepting Orientation: : Oriented to Self, Oriented to Place, Oriented to  Time, Oriented to Situation Alcohol / Substance Use: Tobacco Use(Quit smoking in 2005) Psych Involvement: No (comment)  Admission diagnosis:  Acute respiratory failure with hypoxia (HCC) [J96.01] 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 Acute systolic CHF (congestive heart failure) ([place]) 04/19/2019  . Mass of chest wall, right 04/19/2019  . Acute respiratory distress 04/19/2019  . Acute respiratory failure with hypoxia and hypercapnia ([place]) 04/19/2019  . Bilateral pleural effusion 04/19/2019  . Acute hypoxemic respiratory failure ([place]) 04/19/2019  . Severe anemia 04/18/2019   PCP:  Patient, No Pcp Per Pharmacy:   [id number] - [place], [place] [address] [place] [address] Phone: [phone] Fax: [phone]     Social Determinants of Health (SDOH) Interventions    Readmission Risk Interventions No flowsheet data found.

## 2019-04-19 NOTE — Progress Notes (Signed)
Report given to Calton Dach, RN

## 2019-04-19 NOTE — Progress Notes (Signed)
O2 increased to 6 liters and pt given incentive spirometer and instructed on use per order from Rachael Fee, NP.

## 2019-04-19 NOTE — Progress Notes (Signed)
Pt anxious and O2 sats 85-90%. Rachael Fee, NP notified.

## 2019-04-19 NOTE — Progress Notes (Signed)
*  PRELIMINARY RESULTS* Echocardiogram 2D Echocardiogram has been performed.  Sherrie Sport 04/19/2019, 9:32 AM

## 2019-04-19 NOTE — Progress Notes (Signed)
Patient seen and examined. She remains dyspneic and requires nasal canula O2. She is repetitive in her speech today, similar to yesterday, and seems confused about why she is here.    Today's Vitals   04/19/19 0215 04/19/19 0315 04/19/19 0500 04/19/19 0632  BP: (!) 143/75 137/81  139/75  Pulse: (!) 101 (!) 107  (!) 102  Resp: 20 20  20   Temp: 98 F (36.7 C) 98.1 F (36.7 C)  98 F (36.7 C)  TempSrc:    Temporal  SpO2: 91% 92%  95%  Weight:   50.6 kg 53.5 kg  Height:    5\' 3"  (1.6 m)  PainSc: 0-No pain   0-No pain   Body mass index is 20.9 kg/m.  Physical Exam  Constitutional:  Extremely thin Caucasian female.  NAD.  HENT:  Dry mucous membranes.  Red sores on chin. Flaking skin around mouth.  Eyes: Right eye exhibits no discharge. Left eye exhibits no discharge. No scleral icterus.  Neck: No tracheal deviation present.  Cardiovascular:  Tachycardic.  Pulmonary/Chest: No stridor. No respiratory distress.  On supplemental O2  Abdominal: Soft.  Genitourinary:    Genitourinary Comments: Deferred.   Musculoskeletal:        General: No edema.  Neurological: She is alert.  Skin: There is pallor.  Fungating shoulder mass.  See prior note for photos.    Psychiatric: Her behavior is normal.  Mild perseveration noted.  Confused.   A/P: Due to anesthesia concerns regarding the patient's lack of medical optimization, we will cancel the incisional biopsy of her fungating shoulder mass today.  I have rescheduled it for Monday, January 11th, with the presumption that the patient will be in the hospital at least until then.  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  With that knowledge, we can better plan for either non-surgical intervention or devise the optimal surgical intervention, which may  involve skin grafting or other tissue rearrangement for coverage of what is likely to be a large defect.  In the interim, recommend cardiology consultation, optimize pulmonary status, continue evaluation for potential causes of anemia.  I also strongly suggest social work involvement, based upon my conversation with the patient's daughter.  She has not seen a physician in many years and has had episodes that the daughter describes as "mini strokes," but has failed to follow up with health care providers.    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

## 2019-04-19 NOTE — Plan of Care (Signed)
Continue to educate patient.

## 2019-04-20 DIAGNOSIS — J90 Pleural effusion, not elsewhere classified: Secondary | ICD-10-CM

## 2019-04-20 DIAGNOSIS — Z66 Do not resuscitate: Secondary | ICD-10-CM

## 2019-04-20 DIAGNOSIS — R222 Localized swelling, mass and lump, trunk: Secondary | ICD-10-CM

## 2019-04-20 DIAGNOSIS — R778 Other specified abnormalities of plasma proteins: Secondary | ICD-10-CM

## 2019-04-20 DIAGNOSIS — Z7189 Other specified counseling: Secondary | ICD-10-CM

## 2019-04-20 DIAGNOSIS — I509 Heart failure, unspecified: Secondary | ICD-10-CM

## 2019-04-20 DIAGNOSIS — R7989 Other specified abnormal findings of blood chemistry: Secondary | ICD-10-CM

## 2019-04-20 DIAGNOSIS — I4891 Unspecified atrial fibrillation: Secondary | ICD-10-CM

## 2019-04-20 DIAGNOSIS — Z515 Encounter for palliative care: Secondary | ICD-10-CM

## 2019-04-20 LAB — COMPREHENSIVE METABOLIC PANEL
ALT: 34 U/L (ref 0–44)
AST: 42 U/L — ABNORMAL HIGH (ref 15–41)
Albumin: 2.4 g/dL — ABNORMAL LOW (ref 3.5–5.0)
Alkaline Phosphatase: 56 U/L (ref 38–126)
Anion gap: 13 (ref 5–15)
BUN: 29 mg/dL — ABNORMAL HIGH (ref 8–23)
CO2: 27 mmol/L (ref 22–32)
Calcium: 8.4 mg/dL — ABNORMAL LOW (ref 8.9–10.3)
Chloride: 101 mmol/L (ref 98–111)
Creatinine, Ser: 0.71 mg/dL (ref 0.44–1.00)
GFR calc Af Amer: >60 mL/min (ref >60)
GFR calc non Af Amer: >60 mL/min (ref >60)
Glucose, Bld: 129 mg/dL — ABNORMAL HIGH (ref 70–99)
Potassium: 3.3 mmol/L — ABNORMAL LOW (ref 3.5–5.1)
Sodium: 141 mmol/L (ref 135–145)
Total Bilirubin: 1.2 mg/dL (ref 0.3–1.2)
Total Protein: 6.7 g/dL (ref 6.5–8.1)

## 2019-04-20 LAB — CBC
HCT: 26.9 % — ABNORMAL LOW (ref 36.0–46.0)
Hemoglobin: 8.2 g/dL — ABNORMAL LOW (ref 12.0–15.0)
MCH: 21 pg — ABNORMAL LOW (ref 26.0–34.0)
MCHC: 30.5 g/dL (ref 30.0–36.0)
MCV: 69 fL — ABNORMAL LOW (ref 80.0–100.0)
Platelets: 229 10*3/uL (ref 150–400)
RBC: 3.9 MIL/uL (ref 3.87–5.11)
RDW: 22.8 % — ABNORMAL HIGH (ref 11.5–15.5)
WBC: 13.3 10*3/uL — ABNORMAL HIGH (ref 4.0–10.5)
nRBC: 1.3 % — ABNORMAL HIGH (ref 0.0–0.2)

## 2019-04-20 LAB — TROPONIN I (HIGH SENSITIVITY): Troponin I (High Sensitivity): 1636 ng/L (ref <18)

## 2019-04-20 LAB — MAGNESIUM: Magnesium: 2 mg/dL (ref 1.7–2.4)

## 2019-04-20 MED ORDER — ACETAMINOPHEN 325 MG PO TABS
ORAL_TABLET | ORAL | Status: AC
  Administered 2019-04-20: 650 mg via ORAL
  Filled 2019-04-20: qty 2

## 2019-04-20 MED ORDER — POTASSIUM CHLORIDE CRYS ER 20 MEQ PO TBCR
40.0000 meq | EXTENDED_RELEASE_TABLET | ORAL | Status: AC
  Administered 2019-04-20: 40 meq via ORAL

## 2019-04-20 MED ORDER — POTASSIUM CHLORIDE CRYS ER 20 MEQ PO TBCR
EXTENDED_RELEASE_TABLET | ORAL | Status: AC
  Administered 2019-04-20: 40 meq via ORAL
  Filled 2019-04-20: qty 2

## 2019-04-20 MED ORDER — METOPROLOL TARTRATE 5 MG/5ML IV SOLN: 5.0000 mg | INTRAVENOUS | Status: DC | PRN

## 2019-04-20 MED ORDER — FUROSEMIDE 10 MG/ML IJ SOLN
INTRAMUSCULAR | Status: AC
  Filled 2019-04-20: qty 4

## 2019-04-20 MED ORDER — FUROSEMIDE 10 MG/ML IJ SOLN
INTRAMUSCULAR | Status: AC
  Administered 2019-04-20: 40 mg via INTRAVENOUS
  Filled 2019-04-20: qty 4

## 2019-04-20 MED ORDER — IPRATROPIUM BROMIDE 0.02 % IN SOLN
RESPIRATORY_TRACT | Status: AC
  Filled 2019-04-20: qty 2.5

## 2019-04-20 MED ORDER — POTASSIUM CHLORIDE CRYS ER 20 MEQ PO TBCR
40.0000 meq | EXTENDED_RELEASE_TABLET | ORAL | Status: AC
  Filled 2019-04-20 (×2): qty 2

## 2019-04-20 MED ORDER — METOPROLOL TARTRATE 25 MG PO TABS
ORAL_TABLET | ORAL | Status: AC
  Administered 2019-04-20: 12.5 mg via ORAL
  Filled 2019-04-20: qty 1

## 2019-04-20 MED ORDER — METOPROLOL TARTRATE 25 MG PO TABS
12.5000 mg | ORAL_TABLET | Freq: Two times a day (BID) | ORAL | Status: DC
  Administered 2019-04-20: 12.5 mg via ORAL

## 2019-04-20 MED ORDER — METOPROLOL TARTRATE 5 MG/5ML IV SOLN
INTRAVENOUS | Status: AC
  Filled 2019-04-20: qty 10

## 2019-04-20 MED ORDER — POTASSIUM CHLORIDE CRYS ER 20 MEQ PO TBCR
EXTENDED_RELEASE_TABLET | ORAL | Status: AC
  Administered 2019-04-20: 40 meq via ORAL
  Filled 2019-04-20: qty 1

## 2019-04-20 MED ORDER — ACETAMINOPHEN 325 MG PO TABS
ORAL_TABLET | ORAL | Status: AC
  Filled 2019-04-20: qty 2

## 2019-04-20 NOTE — Progress Notes (Signed)
Dr. Ubaldo Glassing at bedside to assess pt. Pt had short run of A. Fib with RVR at 0611. Dr. Ubaldo Glassing notified.

## 2019-04-20 NOTE — Progress Notes (Signed)
Pt incontinent around [place]. Bed changed and purewick changed.

## 2019-04-20 NOTE — Progress Notes (Signed)
PROGRESS NOTE    Cardelia Cantu  [id number] DOB: [birth year] DOA: 04/18/2019 PCP: Patient, No Pcp Per   Brief Narrative:  74 year old with no significant medical history presenting to the ER with shortness of breath and generalized weakness.  Overall she is a poor historian but started feeling unusual night prior to the admission.  Also reported of right chest wall mass which she noticed had progressively worsened over the course of the past 24 hours.  Noted to be hypoxic initially with severe anemia with hemoglobin of 4.6, elevated BNP of 1400.  Chest x-ray showing bilateral infiltrates.  CTA was negative for PE but showed bilateral moderate-sized pleural effusions.  Echocardiogram showed new diagnosis of congestive heart failure with reduced ejection fraction.   Assessment & Plan:   Principal Problem:   Acute respiratory failure with hypoxia and hypercapnia (HCC) Active Problems:   Severe anemia   Acute systolic CHF (congestive heart failure) (HCC)   Mass of chest wall, right   Acute respiratory distress   Bilateral pleural effusion   Acute hypoxemic respiratory failure (HCC)   Protein-calorie malnutrition, severe   Elevated troponin level not due to acute coronary syndrome  Generalized weakness/symptomatic anemia Severe iron deficiency anemia -Initial hemoglobin 4.6.  Status post 2 units PRBC transfusion -Continue IV iron which will be followed by p.o. iron -FOBT-pending -Seen by gastroenterology-defer work-up until hemodynamically more stable from respiratory standpoint.  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  Class IV -Echocardiogram -EF  25% -Procalcitonin negative -Bronchodilators, incentive spirometer and flutter valve -Continue Lasix 40 mg IV twice daily -Seen by cardiology-deferring ischemic work-up at this time but overall poor  candidate.  Will focus on medical management. -LDL-69, TSH-normal, A1c- 6.0  A. fib with RVR, paroxysmal Elevated troponin -Aggressive repletion of electrolytes.  Not a good candidate for long-term anticoagulation -Continue metoprolol 12.5 mg twice daily  Right chest wall fungating mass -Incisional biopsy on hold given her chronic comorbid conditions  Moderate protein calorie malnutrition -Consult nutrition team  Goals of care -Had extensive discussion regarding goals of care with the patient's daughter regarding her multiple comorbid conditions, advanced age and overall poor prognosis.  At this time daughter would like to focus more on medical management only in the meantime we will also discuss her care with palliative care services. Changed to DNR  DVT prophylaxis: SCDs Code Status: DNR confirmed by daughter Family Communication: Spoke with patient's daughter Disposition Plan: Maintain hospital stay until she feels better from respiratory standpoint.  Currently still on 10 L of high flow nasal cannula.  Consultants:   Cardiology-[facility]  GI  Procedures:   None  Antimicrobials:   None    Subjective: Some confusion noted as patient is slightly delirious but overall calm.  Answers basic questions.  Tells me she did not seek medical attention as she did not feel anything was wrong with her despite of her family insisting.  I spoke with the patient's daughter over the phone who tells me that they have made multiple appointments for the patient to go see a physician but patient refused to do so.  Daughter fully understands current comorbid conditions and is in agreement that we should only proceed with medical management at this time.  Also would like to transition her mother to DNR status in the meantime open to speaking with palliative care service.  Review of Systems Otherwise negative except as per HPI, including: General =  no fevers, chills, dizziness, malaise,  fatigue HEENT/EYES = negative for pain, redness, loss of vision, double vision, blurred vision, loss of hearing, sore throat, hoarseness, dysphagia Cardiovascular= negative for chest pain, palpitation, murmurs, lower extremity swelling Respiratory/lungs= negative for shortness of breath, cough, hemoptysis, wheezing, mucus production Gastrointestinal= negative for nausea, vomiting,, abdominal pain, melena, hematemesis Genitourinary= negative for Dysuria, Hematuria, Change in Urinary Frequency MSK = Negative for arthralgia, myalgias, Back Pain, Joint swelling  Neurology= Negative for headache, seizures, numbness, tingling  Psychiatry= Negative for anxiety, depression, suicidal and homocidal ideation Allergy/Immunology= Medication/Food allergy as listed  Skin= Negative for Rash, lesions, ulcers, itching   Objective: Vitals:   04/20/19 0710 04/20/19 0815 04/20/19 1115 04/20/19 1254  BP: 111/63 119/67    Pulse: 77 (!) 109 92 93  Resp: 18 (!) 27 (!) 24 17  Temp:      TempSrc:      SpO2: (!) 1% 99% 94% 92%  Weight:      Height:        Intake/Output Summary (Last 24 hours) at 04/20/2019 1406 Last data filed at 04/20/2019 1327 Gross per 24 hour  Intake 243 ml  Output 1800 ml  Net -1557 ml   Filed Weights   04/19/19 0500 04/19/19 0632 04/20/19 0500  Weight: 50.6 kg 53.5 kg 49.3 kg    Examination:  Constitutional: Not in acute distress, 10 L nasal cannula, cachectic frail with bilateral temporal wasting. Respiratory: Diminished breath sounds midway up the lung field. Cardiovascular: Normal sinus rhythm, no rubs Abdomen: Nontender nondistended good bowel sounds Musculoskeletal: No edema noted Skin: Right chest wall fungating mass Neurologic: CN 2-12 grossly intact.  And nonfocal Psychiatric: Poor judgment and insight.  Alert to name and place only       Data Reviewed:   CBC: Recent Labs  Lab 04/18/19 1256 04/19/19 0437 04/19/19 2312 04/20/19 0553  WBC 9.3 13.1* 14.8*  13.3*  NEUTROABS 8.0* 11.5*  --   --   HGB 4.6* 7.9* 8.5* 8.2*  HCT 17.3* 26.0* 27.5* 26.9*  MCV 65.8* 68.4* 67.7* 69.0*  PLT 345 274 250 [id number]   Basic Metabolic Panel: Recent Labs  Lab 04/18/19 1256 04/19/19 0437 04/19/19 2312 04/20/19 0553  NA 137 135 137 141  K 3.9 3.6 2.6* 3.3*  CL 105 103 101 101  CO2 14* 23 25 27   GLUCOSE 139* 146* 145* 129*  BUN 25* 33* 30* 29*  CREATININE 0.97 0.79 0.71 0.71  CALCIUM 8.6* 8.4* 8.2* 8.4*  MG 2.2  --  2.1 2.0   GFR: Estimated Creatinine Clearance: 47.3 mL/min (by C-G formula based on SCr of 0.71 mg/dL). Liver Function Tests: Recent Labs  Lab 04/18/19 1256 04/20/19 0553  AST 66* 42*  ALT 38 34  ALKPHOS 58 56  BILITOT 1.0 1.2  PROT 7.6 6.7  ALBUMIN 2.9* 2.4*   No results for input(s): LIPASE, AMYLASE in the last 168 hours. No results for input(s): AMMONIA in the last 168 hours. Coagulation Profile: Recent Labs  Lab 04/18/19 1256  INR 1.6*   Cardiac Enzymes: No results for input(s): CKTOTAL, CKMB, CKMBINDEX, TROPONINI in the last 168 hours. BNP (last 3 results) No results for input(s): PROBNP in the last 8760 hours. HbA1C: Recent Labs    04/19/19 0437  HGBA1C 6.0*   CBG: No results for input(s): GLUCAP in the last 168 hours. Lipid Profile: Recent Labs    04/19/19 0437  CHOL 120  HDL 43  LDLCALC 69  TRIG 41  CHOLHDL 2.8  Thyroid Function Tests: Recent Labs    04/19/19 0437  TSH 1.517   Anemia Panel: Recent Labs    04/18/19 1256 04/18/19 1257  VITAMINB12  --  497  FERRITIN 5*  --   TIBC 430  --   IRON 10*  --    Sepsis Labs: Recent Labs  Lab 04/18/19 1256  PROCALCITON <0.10    Recent Results (from the past 240 hour(s))  Respiratory Panel by RT PCR (Flu A&B, Covid) - Nasopharyngeal Swab     Status: None   Collection Time: 04/18/19  2:00 PM   Specimen: Nasopharyngeal Swab  Result Value Ref Range Status   SARS Coronavirus 2 by RT PCR NEGATIVE NEGATIVE Final    Comment: (NOTE) SARS-CoV-2  target nucleic acids are NOT DETECTED. The SARS-CoV-2 RNA is generally detectable in upper respiratoy specimens during the acute phase of infection. The lowest concentration of SARS-CoV-2 viral copies this assay can detect is 131 copies/mL. A negative result does not preclude SARS-Cov-2 infection and should not be used as the sole basis for treatment or other patient management decisions. A negative result may occur with  improper specimen collection/handling, submission of specimen other than nasopharyngeal swab, presence of viral mutation(s) within the areas targeted by this assay, and inadequate number of viral copies (<131 copies/mL). A negative result must be combined with clinical observations, patient history, and epidemiological information. The expected result is Negative. Fact Sheet for Patients:  PinkCheek.be Fact Sheet for Healthcare Providers:  GravelBags.it This test is not yet ap proved or cleared by the [place] FDA and  has been authorized for detection and/or diagnosis of SARS-CoV-2 by FDA under an Emergency Use Authorization (EUA). This EUA will remain  in effect (meaning this test can be used) for the duration of the COVID-19 declaration under Section 564(b)(1) of the Act, 21 U.S.C. section 360bbb-3(b)(1), unless the authorization is terminated or revoked sooner.    Influenza A by PCR NEGATIVE NEGATIVE Final   Influenza B by PCR NEGATIVE NEGATIVE Final    Comment: (NOTE) The Xpert Xpress SARS-CoV-2/FLU/RSV assay is intended as an aid in  the diagnosis of influenza from Nasopharyngeal swab specimens and  should not be used as a sole basis for treatment. Nasal washings and  aspirates are unacceptable for Xpert Xpress SARS-CoV-2/FLU/RSV  testing. Fact Sheet for Patients: PinkCheek.be Fact Sheet for Healthcare Providers: GravelBags.it This test is  not yet approved or cleared by the [place] FDA and  has been authorized for detection and/or diagnosis of SARS-CoV-2 by  FDA under an Emergency Use Authorization (EUA). This EUA will remain  in effect (meaning this test can be used) for the duration of the  Covid-19 declaration under Section 564(b)(1) of the Act, 21  U.S.C. section 360bbb-3(b)(1), unless the authorization is  terminated or revoked. Performed at [facility], [address] [place], [place] [address]          Radiology Studies: CT Angio Chest PE W and/or Wo Contrast  Result Date: 04/18/2019 CLINICAL DATA:  Shortness of breath, negative COVID-19 test in the ER today EXAM: CT ANGIOGRAPHY CHEST WITH CONTRAST TECHNIQUE: Multidetector CT imaging of the chest was performed using the standard protocol during bolus administration of intravenous contrast. Multiplanar CT image reconstructions and MIPs were obtained to evaluate the vascular anatomy. CONTRAST:  7mL OMNIPAQUE IOHEXOL 350 MG/ML SOLN IV COMPARISON:  None FINDINGS: Cardiovascular: Atherosclerotic calcifications of thoracic aorta, coronary arteries, and proximal great vessels. Aorta normal caliber. No aneurysm or dissection. Pulmonary  arteries adequately opacified and patent. No evidence of pulmonary embolism. Heart unremarkable. No pericardial effusion. Mediastinum/Nodes: Esophagus unremarkable. Base of cervical region normal appearance. Enlarged precarinal lymph node 13 mm short axis. Probable enlarged subcarinal node 15 mm short axis. Few additional scattered upper normal sized mediastinal lymph nodes. Abnormal soft tissue at base of the RIGHT supraclavicular region, likely superficial lobulated mass, 4.5 x 2.6 x 2.4 cm image 5. Lungs/Pleura: Extensive BILATERAL mixed interstitial and airspace infiltrates which could represent pulmonary edema or multifocal infection. BILATERAL moderate pleural effusions. Mild compressive atelectasis of the posterior lower lobes.  No pneumothorax or discrete pulmonary mass. Upper Abdomen: Visualized upper abdomen unremarkable Musculoskeletal: Diffuse osseous demineralization. Superior endplate compression deformity of L1 vertebral body. Review of the MIP images confirms the above findings. IMPRESSION: No evidence of pulmonary embolism. Scattered atherosclerotic calcifications including coronary arteries. Extensive BILATERAL mixed interstitial and airspace infiltrates question multifocal pneumonia or pulmonary edema. Mildly prominent lymph nodes in mediastinum, nonspecific. BILATERAL moderate-sized pleural effusions. Lobulated superficial soft tissue mass superficially at the inferior RIGHT supraclavicular region, recommend correlation with physical exam to exclude tumor. Aortic Atherosclerosis (ICD10-I70.0). Findings called to Dr. Joni Fears on 04/18/2019 at 1501 hours. Electronically Signed   By: Lavonia Dana M.D.   On: 04/18/2019 15:03   DG CHEST PORT 1 VIEW  Result Date: 04/19/2019 CLINICAL DATA:  Dyspnea EXAM: PORTABLE CHEST 1 VIEW COMPARISON:  04/18/2019 FINDINGS: Normal heart size. Small bilateral pleural effusions. Diffuse bilateral interstitial and airspace densities are again identified and appear unchanged. No atelectasis or pneumothorax. IMPRESSION: 1. No change in aeration to the lungs compared with prior exam. 2. Stable small bilateral pleural effusions. Electronically Signed   By: Kerby Moors M.D.   On: 04/19/2019 09:00   ECHOCARDIOGRAM COMPLETE  Result Date: 04/19/2019   ECHOCARDIOGRAM REPORT   Patient Name:   [facility] Date of Exam: 04/19/2019 Medical Rec #:  [id number]        Height:       63.0 in Accession #:    [id number]       Weight:       118.0 lb Date of Birth:  [birth year]        BSA:          1.55 m Patient Age:    71 years         BP:           139/75 mmHg Patient Gender: F                HR:           102 bpm. Exam Location:  [facility] Procedure: 2D Echo, Color Doppler and Cardiac Doppler Indications:      Dyspnea 786.09  History:         Patient has no prior history of Echocardiogram examinations.                  CHF.  Sonographer:     Sherrie Sport RDCS (AE) Referring Phys:  [place] Diagnosing Phys: Bartholome Bill MD IMPRESSIONS  1. Left ventricular ejection fraction, by visual estimation, is 25 to 30%. The left ventricle has moderate to severely decreased function. Left ventricular septal wall thickness was normal. There is no left ventricular hypertrophy.  2. Moderately dilated left ventricular internal cavity size.  3. The left ventricle demonstrates global hypokinesis.  4. Global right ventricle has normal systolic function.The right ventricular size is mildly enlarged. No increase in right ventricular wall thickness.  5. Left  atrial size was mildly dilated.  6. Right atrial size was mildly dilated.  7. Large pleural effusion in the left lateral region.  8. The mitral valve is grossly normal. Trivial mitral valve regurgitation.  9. The tricuspid valve is grossly normal. 10. The aortic valve was not well visualized. Aortic valve regurgitation is trivial. 11. The pulmonic valve was not well visualized. Pulmonic valve regurgitation is trivial. 12. The aortic root was not well visualized. FINDINGS  Left Ventricle: Left ventricular ejection fraction, by visual estimation, is 25 to 30%. The left ventricle has moderate to severely decreased function. The left ventricle demonstrates global hypokinesis. The left ventricular internal cavity size was moderately dilated left ventricle. There is no left ventricular hypertrophy. Right Ventricle: The right ventricular size is mildly enlarged. No increase in right ventricular wall thickness. Global RV systolic function is has normal systolic function. Left Atrium: Left atrial size was mildly dilated. Right Atrium: Right atrial size was mildly dilated Pericardium: There is no evidence of pericardial effusion. There is a large pleural effusion in the left lateral region.  Mitral Valve: The mitral valve is grossly normal. Trivial mitral valve regurgitation. Tricuspid Valve: The tricuspid valve is grossly normal. Tricuspid valve regurgitation is mild. Aortic Valve: The aortic valve was not well visualized. Aortic valve regurgitation is trivial. Aortic valve mean gradient measures 2.5 mmHg. Aortic valve peak gradient measures 5.3 mmHg. Aortic valve area, by VTI measures 1.82 cm. Pulmonic Valve: The pulmonic valve was not well visualized. Pulmonic valve regurgitation is trivial. Pulmonic regurgitation is trivial. Aorta: The aortic root was not well visualized. IAS/Shunts: No atrial level shunt detected by color flow Doppler.  LEFT VENTRICLE PLAX 2D LVIDd:         4.86 cm       Diastology LVIDs:         4.17 cm       LV e' lateral: 5.66 cm/s LV PW:         0.91 cm       LV e' medial:  4.13 cm/s LV IVS:        1.18 cm LVOT diam:     1.90 cm LV SV:         33 ml LV SV Index:   21.66 LVOT Area:     2.84 cm  LV Volumes (MOD) LV area d, A2C:    43.00 cm LV area d, A4C:    34.10 cm LV area s, A2C:    35.80 cm LV area s, A4C:    29.50 cm LV major d, A2C:   8.95 cm LV major d, A4C:   7.31 cm LV major s, A2C:   8.52 cm LV major s, A4C:   7.45 cm LV vol d, MOD A2C: 171.0 ml LV vol d, MOD A4C: 128.0 ml LV vol s, MOD A2C: 124.0 ml LV vol s, MOD A4C: 93.6 ml LV SV MOD A2C:     47.0 ml LV SV MOD A4C:     128.0 ml LV SV MOD BP:      48.2 ml RIGHT VENTRICLE TAPSE (M-mode): 2.7 cm LEFT ATRIUM             Index       RIGHT ATRIUM           Index LA diam:        3.40 cm 2.20 cm/m  RA Area:     12.00 cm LA Vol (A2C):   71.5 ml 46.27 ml/m RA Volume:  31.30 ml  20.26 ml/m LA Vol (A4C):   34.9 ml 22.59 ml/m LA Biplane Vol: 52.4 ml 33.91 ml/m  AORTIC VALVE                   PULMONIC VALVE AV Area (Vmax):    1.98 cm    PV Vmax:        0.62 m/s AV Area (Vmean):   2.04 cm    PV Peak grad:   1.5 mmHg AV Area (VTI):     1.82 cm    RVOT Peak grad: 2 mmHg AV Vmax:           115.50 cm/s AV Vmean:           67.200 cm/s AV VTI:            0.160 m AV Peak Grad:      5.3 mmHg AV Mean Grad:      2.5 mmHg LVOT Vmax:         80.50 cm/s LVOT Vmean:        48.400 cm/s LVOT VTI:          0.103 m LVOT/AV VTI ratio: 0.64  AORTA Ao Root diam: 2.70 cm  SHUNTS Systemic VTI:  0.10 m Systemic Diam: 1.90 cm  Bartholome Bill MD Electronically signed by Bartholome Bill MD Signature Date/Time: 04/19/2019/10:49:28 AM    Final         Scheduled Meds: . feeding supplement (ENSURE ENLIVE)  237 mL Oral BID BM  . furosemide  40 mg Intravenous Q12H  . ipratropium      . ipratropium      . mouth rinse  15 mL Mouth Rinse BID  . [COMPLETED] metoprolol tartrate  12.5 mg Oral Once  . metoprolol tartrate  12.5 mg Oral BID  . multivitamin with minerals  1 tablet Oral Daily  . potassium chloride  40 mEq Oral Q4H  . sodium chloride flush  3 mL Intravenous Q12H   Continuous Infusions: . sodium chloride    . iron sucrose Stopped (04/20/19 1327)     LOS: 2 days   Time spent= 45 mins    Graycen Sadlon Arsenio Loader, MD [facility]  If 7PM-7AM, please contact night-coverage  04/20/2019, 2:06 PM

## 2019-04-20 NOTE — Progress Notes (Signed)
Pt in A.Fib RVR rate 170s. Sharion Settler, NP paged. Pt awake and alert, but restless.

## 2019-04-20 NOTE — Consult Note (Signed)
Consultation Note Date: 04/20/2019   Patient Name: Kimberly Cantu  DOB: 07/09/1945  MRN: [id number]  Age / Sex: 74 y.o., female   PCP: Patient, No Pcp Per Referring Physician: Damita Lack, MD   REASON FOR CONSULTATION:Establishing goals of care  Palliative Care consult requested for goals of care in this 74 y.o. female who has a limited medical history due to no recent medical care inpatient or outpatient. Kimberly Cantu presents to the ED from home with complaints of generalized weakness and shortness of breath. 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 Hgb 4.6, BNP 1431. Chest x-ray showed bilateral interstitial and alveolar infiltrates.  CTA did not show PE but there was evidence of bilateral moderate-sized pleural effusions extensive bilateral mixed interstitial and airspace infiltrates and lobulated superficial soft tissue mass superficially at the inferior right supraclavicular region. Patient has been seen by Cardiology, General surgery, and GI. Echo revealed EF 25-30%.   Clinical Assessment and Goals of Care: I have reviewed medical records including lab results, imaging, Epic notes, and MAR, received report from the bedside RN, and assessed the patient. I met at the bedside with patient and spoke with patient's daughter, Kimberly Cantu by phone to discuss diagnosis prognosis, [place], EOL wishes, disposition and options. Kimberly Cantu is awake, alert, and oriented with some intermittent confusion noted. She denies pain but does report some shortness of breath at times.   I introduced Palliative Medicine as specialized medical care for people living with serious illness. It focuses on providing relief from the symptoms and stress of a serious illness. The goal is to improve quality of life for both the patient and the family. Daughter verbalized understanding and appreciation.   We  discussed a brief life review of the patient, along with her functional and nutritional status. 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 She has been married to her current husband for 11 years sharing this is her 4th marriage. She has 4 children whom are all supportive. She enjoys spending time with family.   Prior to admission she reports performing all ADLs independently but with some assistance at times due to weakness. She reports cleaning her home just a few days ago but does endorse weakness and shortness of breath. Noticeable decrease in appetite. She reports she has not been to a provider in years and have no interest in going to see one. Daughter confirms expressing her mother has always had a phobia or dislike of medical care.   We discussed Her current illness and what it means in the larger context of Her on-going co-morbidities. With specific discussions regarding her cardiac failure, fungating mass, and overall decline in health. Natural disease trajectory and expectations at EOL were discussed.  Kimberly Cantu states she doesn't like hospitals and just want to get back home to her family. She reports "I am not going to the doctors back and forth. I want to go home and whatever happens happens!" She shares her husband has fussed with her about going to the doctor previously.   I discussed patient's current illness and prognosis in more depth with daughter. Kimberly Cantu verbalized understanding and expresses she plans to honor her mother's wishes knowing she has never been a fan of healthcare and interventions. She expresses she is unsure why she feels that way but will in an unselfish way support her and so  will her siblings per their discussions.   Kimberly Cantu reports patient lives at home with her husband who is also 24 with some health concerns. However patient and husband's wishes are for patient to return home. Kimberly Cantu states family has agreed patient will return to her home with all  family pitching in and offering support to honor her wishes. In the event patient cannot be managed in her home Kimberly Cantu plans to relocate patient to her home for more direct care.   I attempted to elicit values and goals of care important to the patient. .  The difference between aggressive medical intervention and comfort care was considered in light of the patient's goals of care. I educated patient/family on what comfort care measures would look like. We discussed patient would no longer receive aggressive medical interventions such as continuous vital signs, lab work, radiology testing, or medications not focused on comfort. Goals during comfort/hospice care is for patient to remain in her home with support of symptom management and not returning to the hospital unless symptoms could not effectively be managed in the home with hospice support. Daughter and patient verbalized understanding and appreciation. Kimberly Cantu reports this is the goal for care once patient returns home.   Family would like patient to continue to receive medical management with no escalation of care while hospitalized. They are hopeful once she is medically optimized she would then be able to return home with a goal of comfort focused care and spend what moments she has left with family and support. Support given.   Patient confirms that her daughter, Kimberly Cantu is her contact and medical decision maker. Kimberly Cantu and patient both confirm DNR/DNI status.   Hospice and Palliative Care services outpatient were explained and offered. Patient and family verbalized their understanding and awareness of both palliative and hospice's goals and philosophy of care. Kimberly Cantu confirms wishes for outpatient hospice support given goals is for comfort care at discharge with no intentions of follow up care or re-hospitalizations. Support given. I discussed equipment needs in the home that may be essential as patient continues to decline in health. Daughter  requesting oxygen and bedside commode initially with plans to evaluate further once patient returns home.   Questions and concerns were addressed.The family was encouraged to call with questions or concerns.  PMT will continue to support holistically.   SOCIAL HISTORY:     reports that she has quit smoking. She has never used smokeless tobacco. She reports that she does not drink alcohol or use drugs.  CODE STATUS: DNR  ADVANCE DIRECTIVES: Kimberly Cantu (daughter)   SYMPTOM MANAGEMENT: per attending   Palliative Prophylaxis:   Aspiration, Bowel Regimen, Delirium Protocol, Frequent Pain Assessment and Palliative Wound Care  PSYCHO-SOCIAL/SPIRITUAL:  Support System: Family   Desire for further Chaplaincy support:No  Additional Recommendations (Limitations, Scope, Preferences):  Avoid Hospitalization, No Artificial Feeding, No Surgical Procedures and Medical Management with goal of returning home for comfort care, DNR/DNI   PAST MEDICAL HISTORY: Past Medical History:  Diagnosis Date  . Anemia   . CHF (congestive heart failure) ([place])     PAST SURGICAL HISTORY:  Past Surgical History:  Procedure Laterality Date  . HIP PINNING Left     ALLERGIES:  has No Known Allergies.   MEDICATIONS:  Current Facility-Administered Medications  Medication Dose Route Frequency Provider Last Rate Last Admin  . 0.9 %  sodium chloride infusion  250 mL Intravenous PRN Jennye Boroughs, MD      . acetaminophen (TYLENOL) tablet 650  mg  650 mg Oral Q4H PRN Jennye Boroughs, MD   650 mg at 04/20/19 0043  . feeding supplement (ENSURE ENLIVE) (ENSURE ENLIVE) liquid 237 mL  237 mL Oral BID BM Amin, Ankit Chirag, MD   237 mL at 04/20/19 1431  . furosemide (LASIX) injection 40 mg  40 mg Intravenous Q12H Amin, Ankit Chirag, MD   40 mg at 04/20/19 0956  . ipratropium (ATROVENT) 0.02 % nebulizer solution           . ipratropium (ATROVENT) 0.02 % nebulizer solution           . ipratropium-albuterol  (DUONEB) 0.5-2.5 (3) MG/3ML nebulizer solution 3 mL  3 mL Nebulization Q4H PRN Amin, Ankit Chirag, MD      . iron sucrose (VENOFER) 100 mg in sodium chloride 0.9 % 100 mL IVPB  100 mg Intravenous Q24H Damita Lack, MD   Stopped at 04/20/19 1327  . MEDLINE mouth rinse  15 mL Mouth Rinse BID Amin, Ankit Chirag, MD   15 mL at 04/19/19 2131  . [COMPLETED] metoprolol tartrate (LOPRESSOR) tablet 12.5 mg  12.5 mg Oral Once Sharion Settler, NP   12.5 mg at 04/19/19 2319  . metoprolol tartrate (LOPRESSOR) tablet 12.5 mg  12.5 mg Oral BID Sharion Settler, NP   12.5 mg at 04/20/19 0957  . multivitamin with minerals tablet 1 tablet  1 tablet Oral Daily Damita Lack, MD   1 tablet at 04/20/19 0959  . ondansetron (ZOFRAN) injection 4 mg  4 mg Intravenous Q6H PRN Jennye Boroughs, MD      . polyethylene glycol (MIRALAX / GLYCOLAX) packet 17 g  17 g Oral Daily PRN Amin, Ankit Chirag, MD      . senna-docusate (Senokot-S) tablet 2 tablet  2 tablet Oral QHS PRN Amin, Ankit Chirag, MD      . sodium chloride flush (NS) 0.9 % injection 3 mL  3 mL Intravenous Q12H Jennye Boroughs, MD   3 mL at 04/20/19 1327  . sodium chloride flush (NS) 0.9 % injection 3 mL  3 mL Intravenous PRN Jennye Boroughs, MD        VITAL SIGNS: BP 119/67 (BP Location: Left Arm)   Pulse 93   Temp (!) 97.3 F (36.3 C) (Temporal)   Resp 17   Ht _0  (1.6 m)   Wt 49.3 kg   SpO2 92%   BMI 19.25 kg/m  Filed Weights   04/19/19 0500 04/19/19 0632 04/20/19 0500  Weight: 50.6 kg 53.5 kg 49.3 kg    Estimated body mass index is 19.25 kg/m as calculated from the following:   Height as of this encounter: _1  (1.6 m).   Weight as of this encounter: 49.3 kg.  LABS: CBC:    Component Value Date/Time   WBC 13.3 (H) 04/20/2019 0553   HGB 8.2 (L) 04/20/2019 0553   HCT 26.9 (L) 04/20/2019 0553   PLT 229 04/20/2019 0553   Comprehensive Metabolic Panel:    Component Value Date/Time   NA 141 04/20/2019 0553   K 3.3 (L) 04/20/2019  0553   CO2 27 04/20/2019 0553   BUN 29 (H) 04/20/2019 0553   CREATININE 0.71 04/20/2019 0553   ALBUMIN 2.4 (L) 04/20/2019 0553     Review of Systems  Constitutional: Positive for fatigue.  Skin:       Right shoulder fungating mass  Neurological: Positive for weakness.  All other systems reviewed and are negative.  Physical Exam General: NAD, frail chronically-ill appearing,  thin Cardiovascular: regular rate and rhythm Pulmonary: diminished bilaterally Abdomen: soft, nontender, + bowel sounds Extremities: no edema, no joint deformities Skin: no rashes, right chest wall fungating mass Neurological: poor judgement or insight, A&O x3 with intermittent confusion. Mood appropriate   Prognosis: < 6 months in the setting of CHF, EF 25-30%, right chest wall fungating mass, goal of home with hospice for comfort.   Discharge Planning:  Home with Hospice  Recommendations:  DNR/DNI-as confirmed by patient/daughter  Continue current plan of care with a goal of returning home with hospice for comfort.   Patient expressed wishes to return home and no interest in aggressive medical interventions. Daughter also confirms similar wishes requesting home with hospice.   Outpatient hospice referral with DME bedside commode and oxygen. Referral placed and Santiago Glad, RN [facility]) aware.   PMT will continue to support and follow as needed.    Palliative Performance Scale: PPS 30-40%              Patient and daughter expressed understanding and was in agreement with this plan.   Thank you for allowing the Palliative Medicine Team to assist in the care of this patient.  Time In: 1505 Time Out: 1610 Time Total: 65 min.   Visit consisted of counseling and education dealing with the complex and emotionally intense issues of symptom management and palliative care in the setting of serious and potentially life-threatening illness.Greater than 50%  of this time was spent counseling and  coordinating care related to the above assessment and plan.  Signed by:  Alda Lea, AGPCNP-BC Palliative Medicine Team  Phone: [phone] Fax: [phone] Pager: [phone] Amion: Bjorn Pippin

## 2019-04-20 NOTE — Progress Notes (Signed)
Pt had approx 3 min episode of A. Fib RVR rate up to 180s. Pt converted to NSR on her own. Dr. Posey Pronto notified. Order received for PRN IV metoprolol.

## 2019-04-20 NOTE — Progress Notes (Signed)
At 2243 EKG performed per order from Sharion Settler, NP. NP arrived at bedside. Pt received metoprolol approx 2 mg. 3 mg administered in leaking IV. The other 2 mg administered in patent IV. Pt converted to NSR 80s at 2300 and then went back into A. Fib RVR. Additional 5 mg IV metoprolol given. At 2314 pt converted back into NSR 80s and remained in NSR. Oral metoprolol administered. Repeat EKG performed. Pt stable, alert and denies complaints.

## 2019-04-20 NOTE — Progress Notes (Signed)
Notified Sharion Settler, NP of critical potassium and critical troponin levels.

## 2019-04-20 NOTE — Progress Notes (Addendum)
Patient Name: Kimberly Cantu Date of Encounter: 04/20/2019  Hospital Problem List     Principal Problem:   Acute respiratory failure with hypoxia and hypercapnia (HCC) Active Problems:   Severe anemia   Acute systolic CHF (congestive heart failure) (HCC)   Mass of chest wall, right   Acute respiratory distress   Bilateral pleural effusion   Acute hypoxemic respiratory failure (HCC)   Protein-calorie malnutrition, severe    Patient Profile      74 year old female with no significant past medical history who presented to the ED on 04/17/18 for worsening shortness of breath as well as a right upper chest wall mass.  Workup in the ED was significant for oxygen saturation of 79% on room air, BNP of 1431, hemoglobin of 4.6, chest xray revealing pulmonary edema, and CT angio revealing moderate pleural effusions bilaterally with a right lobulated soft tissue mass in the supraclavicular region.  Echo showed severely reduced LV function globally with an EF around 25 to 30%.  She required high flow oxygen on admission. She was transfused 2 units of blood and treated with IV Lasix.  She is scheduled for a biopsy of mass for 04/23/19.     Subjective   Somewhat somnolent this morning.  Had an episode of A. fib with RVR last p.m.  Appreciate excellent care per covering team.  Denies chest pain.   Inpatient Medications    . feeding supplement (ENSURE ENLIVE)  237 mL Oral BID BM  . furosemide  40 mg Intravenous Q12H  . mouth rinse  15 mL Mouth Rinse BID  . [COMPLETED] metoprolol tartrate  12.5 mg Oral Once  . metoprolol tartrate  12.5 mg Oral BID  . multivitamin with minerals  1 tablet Oral Daily  . sodium chloride flush  3 mL Intravenous Q12H    Vital Signs    Vitals:   04/20/19 0210 04/20/19 0310 04/20/19 0500 04/20/19 0611  BP: 107/60 93/60  (!) 92/53  Pulse: 84 72  73  Resp: 19 20  18   Temp:  (!) 97.3 F (36.3 C)    TempSrc:  Temporal    SpO2: 93% 95%  95%  Weight:   49.3 kg    Height:        Intake/Output Summary (Last 24 hours) at 04/20/2019 0734 Last data filed at 04/20/2019 0400 Gross per 24 hour  Intake 240 ml  Output 2700 ml  Net -2460 ml   Filed Weights   04/19/19 0500 04/19/19 0632 04/20/19 0500  Weight: 50.6 kg 53.5 kg 49.3 kg    Physical Exam    GEN: Somewhat thin elderly female in no acute distress HEENT: normal.  Neck: Supple, no JVD, carotid bruits, or masses. Cardiac: RRR, no murmurs, rubs, or gallops. No clubbing, cyanosis, edema.  Radials/DP/PT 2+ and equal bilaterally.  Respiratory:  Respirations regular and unlabored, clear to auscultation bilaterally. GI: Soft, nontender, nondistended, BS + x 4. MS: no deformity or atrophy. Skin: Fungating lesion in her right clavicular area  Neuro:  Strength and sensation are intact. Psych: Somewhat anxious when awake.  Somewhat somnolent this morning.  T.  Labs    CBC Recent Labs    04/18/19 1256 04/19/19 0437 04/19/19 2312 04/20/19 0553  WBC 9.3 13.1* 14.8* 13.3*  NEUTROABS 8.0* 11.5*  --   --   HGB 4.6* 7.9* 8.5* 8.2*  HCT 17.3* 26.0* 27.5* 26.9*  MCV 65.8* 68.4* 67.7* 69.0*  PLT 345 274 250 [id number]   Basic Metabolic  Panel Recent Labs    04/19/19 2312 04/20/19 0553  NA 137 141  K 2.6* 3.3*  CL 101 101  CO2 25 27  GLUCOSE 145* 129*  BUN 30* 29*  CREATININE 0.71 0.71  CALCIUM 8.2* 8.4*  MG 2.1 2.0   Liver Function Tests Recent Labs    04/18/19 1256 04/20/19 0553  AST 66* 42*  ALT 38 34  ALKPHOS 58 56  BILITOT 1.0 1.2  PROT 7.6 6.7  ALBUMIN 2.9* 2.4*   No results for input(s): LIPASE, AMYLASE in the last 72 hours. Cardiac Enzymes No results for input(s): CKTOTAL, CKMB, CKMBINDEX, TROPONINI in the last 72 hours. BNP Recent Labs    04/18/19 1256  BNP 1,431.0*   D-Dimer No results for input(s): DDIMER in the last 72 hours. Hemoglobin A1C Recent Labs    04/19/19 0437  HGBA1C 6.0*   Fasting Lipid Panel Recent Labs    04/19/19 0437  CHOL 120  HDL 43   LDLCALC 69  TRIG 41  CHOLHDL 2.8   Thyroid Function Tests Recent Labs    04/19/19 0437  TSH 1.517    Telemetry    Currently sinus rhythm.  Transient A. fib with RVR yesterday late evening.  She had a brief run of atrial fibrillation early this morning.  Asymptomatic.  ECG    Sinus rhythm with diffuse ST-T wave changes  Radiology    CT Angio Chest PE W and/or Wo Contrast  Result Date: 04/18/2019 CLINICAL DATA:  Shortness of breath, negative COVID-19 test in the ER today EXAM: CT ANGIOGRAPHY CHEST WITH CONTRAST TECHNIQUE: Multidetector CT imaging of the chest was performed using the standard protocol during bolus administration of intravenous contrast. Multiplanar CT image reconstructions and MIPs were obtained to evaluate the vascular anatomy. CONTRAST:  62mL OMNIPAQUE IOHEXOL 350 MG/ML SOLN IV COMPARISON:  None FINDINGS: Cardiovascular: Atherosclerotic calcifications of thoracic aorta, coronary arteries, and proximal great vessels. Aorta normal caliber. No aneurysm or dissection. Pulmonary arteries adequately opacified and patent. No evidence of pulmonary embolism. Heart unremarkable. No pericardial effusion. Mediastinum/Nodes: Esophagus unremarkable. Base of cervical region normal appearance. Enlarged precarinal lymph node 13 mm short axis. Probable enlarged subcarinal node 15 mm short axis. Few additional scattered upper normal sized mediastinal lymph nodes. Abnormal soft tissue at base of the RIGHT supraclavicular region, likely superficial lobulated mass, 4.5 x 2.6 x 2.4 cm image 5. Lungs/Pleura: Extensive BILATERAL mixed interstitial and airspace infiltrates which could represent pulmonary edema or multifocal infection. BILATERAL moderate pleural effusions. Mild compressive atelectasis of the posterior lower lobes. No pneumothorax or discrete pulmonary mass. Upper Abdomen: Visualized upper abdomen unremarkable Musculoskeletal: Diffuse osseous demineralization. Superior endplate  compression deformity of L1 vertebral body. Review of the MIP images confirms the above findings. IMPRESSION: No evidence of pulmonary embolism. Scattered atherosclerotic calcifications including coronary arteries. Extensive BILATERAL mixed interstitial and airspace infiltrates question multifocal pneumonia or pulmonary edema. Mildly prominent lymph nodes in mediastinum, nonspecific. BILATERAL moderate-sized pleural effusions. Lobulated superficial soft tissue mass superficially at the inferior RIGHT supraclavicular region, recommend correlation with physical exam to exclude tumor. Aortic Atherosclerosis (ICD10-I70.0). Findings called to Dr. Joni Fears on 04/18/2019 at 1501 hours. Electronically Signed   By: Lavonia Dana M.D.   On: 04/18/2019 15:03   DG CHEST PORT 1 VIEW  Result Date: 04/19/2019 CLINICAL DATA:  Dyspnea EXAM: PORTABLE CHEST 1 VIEW COMPARISON:  04/18/2019 FINDINGS: Normal heart size. Small bilateral pleural effusions. Diffuse bilateral interstitial and airspace densities are again identified and appear unchanged. No atelectasis or pneumothorax.  IMPRESSION: 1. No change in aeration to the lungs compared with prior exam. 2. Stable small bilateral pleural effusions. Electronically Signed   By: Kerby Moors M.D.   On: 04/19/2019 09:00   DG Chest Portable 1 View  Result Date: 04/18/2019 CLINICAL DATA:  Shortness of breath. Right upper chest mass. Sudden onset of shortness of breath. EXAM: PORTABLE CHEST 1 VIEW COMPARISON:  12/12/2005 FINDINGS: Artifact overlies the chest. Heart size is normal. There is abnormal interstitial and alveolar pulmonary density in a bat wing configuration suggesting acute interstitial and alveolar edema. Bronchopneumonia could have this appearance. Possible sub pulmonic pleural effusions. No discernible discrete mass lesion. No acute bone finding. IMPRESSION: Bilateral interstitial and alveolar pulmonary density in a perihilar pattern suggesting acute pulmonary edema.  Bronchopneumonia could have this appearance. Suspicion of sub pulmonic pleural fluid. Electronically Signed   By: Nelson Chimes M.D.   On: 04/18/2019 13:16   ECHOCARDIOGRAM COMPLETE  Result Date: 04/19/2019   ECHOCARDIOGRAM REPORT   Patient Name:   HUDA Wanless Date of Exam: 04/19/2019 Medical Rec #:  [id number]        Height:       63.0 in Accession #:    [id number]       Weight:       118.0 lb Date of Birth:  01-18-1946        BSA:          1.55 m Patient Age:    53 years         BP:           139/75 mmHg Patient Gender: F                HR:           102 bpm. Exam Location:  [facility] Procedure: 2D Echo, Color Doppler and Cardiac Doppler Indications:     Dyspnea 786.09  History:         Patient has no prior history of Echocardiogram examinations.                  CHF.  Sonographer:     Sherrie Sport RDCS (AE) Referring Phys:  Bennett Diagnosing Phys: Bartholome Bill MD IMPRESSIONS  1. Left ventricular ejection fraction, by visual estimation, is 25 to 30%. The left ventricle has moderate to severely decreased function. Left ventricular septal wall thickness was normal. There is no left ventricular hypertrophy.  2. Moderately dilated left ventricular internal cavity size.  3. The left ventricle demonstrates global hypokinesis.  4. Global right ventricle has normal systolic function.The right ventricular size is mildly enlarged. No increase in right ventricular wall thickness.  5. Left atrial size was mildly dilated.  6. Right atrial size was mildly dilated.  7. Large pleural effusion in the left lateral region.  8. The mitral valve is grossly normal. Trivial mitral valve regurgitation.  9. The tricuspid valve is grossly normal. 10. The aortic valve was not well visualized. Aortic valve regurgitation is trivial. 11. The pulmonic valve was not well visualized. Pulmonic valve regurgitation is trivial. 12. The aortic root was not well visualized. FINDINGS  Left Ventricle: Left ventricular ejection fraction, by  visual estimation, is 25 to 30%. The left ventricle has moderate to severely decreased function. The left ventricle demonstrates global hypokinesis. The left ventricular internal cavity size was moderately dilated left ventricle. There is no left ventricular hypertrophy. Right Ventricle: The right ventricular size is mildly enlarged. No increase in right ventricular wall thickness.  Global RV systolic function is has normal systolic function. Left Atrium: Left atrial size was mildly dilated. Right Atrium: Right atrial size was mildly dilated Pericardium: There is no evidence of pericardial effusion. There is a large pleural effusion in the left lateral region. Mitral Valve: The mitral valve is grossly normal. Trivial mitral valve regurgitation. Tricuspid Valve: The tricuspid valve is grossly normal. Tricuspid valve regurgitation is mild. Aortic Valve: The aortic valve was not well visualized. Aortic valve regurgitation is trivial. Aortic valve mean gradient measures 2.5 mmHg. Aortic valve peak gradient measures 5.3 mmHg. Aortic valve area, by VTI measures 1.82 cm. Pulmonic Valve: The pulmonic valve was not well visualized. Pulmonic valve regurgitation is trivial. Pulmonic regurgitation is trivial. Aorta: The aortic root was not well visualized. IAS/Shunts: No atrial level shunt detected by color flow Doppler.  LEFT VENTRICLE PLAX 2D LVIDd:         4.86 cm       Diastology LVIDs:         4.17 cm       LV e' lateral: 5.66 cm/s LV PW:         0.91 cm       LV e' medial:  4.13 cm/s LV IVS:        1.18 cm LVOT diam:     1.90 cm LV SV:         33 ml LV SV Index:   21.66 LVOT Area:     2.84 cm  LV Volumes (MOD) LV area d, A2C:    43.00 cm LV area d, A4C:    34.10 cm LV area s, A2C:    35.80 cm LV area s, A4C:    29.50 cm LV major d, A2C:   8.95 cm LV major d, A4C:   7.31 cm LV major s, A2C:   8.52 cm LV major s, A4C:   7.45 cm LV vol d, MOD A2C: 171.0 ml LV vol d, MOD A4C: 128.0 ml LV vol s, MOD A2C: 124.0 ml LV vol  s, MOD A4C: 93.6 ml LV SV MOD A2C:     47.0 ml LV SV MOD A4C:     128.0 ml LV SV MOD BP:      48.2 ml RIGHT VENTRICLE TAPSE (M-mode): 2.7 cm LEFT ATRIUM             Index       RIGHT ATRIUM           Index LA diam:        3.40 cm 2.20 cm/m  RA Area:     12.00 cm LA Vol (A2C):   71.5 ml 46.27 ml/m RA Volume:   31.30 ml  20.26 ml/m LA Vol (A4C):   34.9 ml 22.59 ml/m LA Biplane Vol: 52.4 ml 33.91 ml/m  AORTIC VALVE                   PULMONIC VALVE AV Area (Vmax):    1.98 cm    PV Vmax:        0.62 m/s AV Area (Vmean):   2.04 cm    PV Peak grad:   1.5 mmHg AV Area (VTI):     1.82 cm    RVOT Peak grad: 2 mmHg AV Vmax:           115.50 cm/s AV Vmean:          67.200 cm/s AV VTI:  0.160 m AV Peak Grad:      5.3 mmHg AV Mean Grad:      2.5 mmHg LVOT Vmax:         80.50 cm/s LVOT Vmean:        48.400 cm/s LVOT VTI:          0.103 m LVOT/AV VTI ratio: 0.64  AORTA Ao Root diam: 2.70 cm  SHUNTS Systemic VTI:  0.10 m Systemic Diam: 1.90 cm  Bartholome Bill MD Electronically signed by Bartholome Bill MD Signature Date/Time: 04/19/2019/10:49:28 AM    Final     Assessment & Plan    1.  Acute HFrEF             -Echo revealing an EF of 25%-30% with global hypokinesis no regional wall motion abnormality noted.  Etiology unclear.  Ischemic versus nonischemic cardiomyopathy.  Treatment will remain medical as she is not a candidate for any invasive evaluation or intervention based on comorbidities.  Now on metoprolol succinate tartrate 12.5 twice daily.  Will attempt to convert this to long-acting metoprolol based on symptoms today and heart rate and blood pressure.             -Continue to diuresis with Lasix 40mg  BID; continue daily weights, I's and O's not an ideal candidate currently due to soft blood pressure for afterload reduction.  As her hospital stay progresses, will need to consider this.             -As patient remains chest pain free, will defer ischemic workup to an outpatient setting as she is not a  candidate for left heart catheterization with severe anemia               2.  Right chest wall fungating mass              -Etiology unclear.  Biopsy is pending however currently patient is deferring this.  Patient would be high risk from a cardiac standpoint for surgical intervention.  Will attempt to optimize her status.  3.  Atrial fibrillation  -Patient developed atrial fibrillation with rapid ventricular response last p.m.  Converted back to sinus rhythm with IV metoprolol followed by p.o. metoprolol.  Appreciate excellent care last p.m.  Would continue with metoprolol tartrate at 12.5 mg twice daily and follow.  Patient had a brief run of A. fib earlier this morning.  Potassium was 2.7 yesterday is improved to 3.3 today.  We will continue to attempt to get her K greater than 4 and she should have a magnesium greater than 2.0.  Not a candidate for chronic anticoagulation due to comorbidities  4.  Abnormal troponin  -Elevated troponin last p.m.  Likely secondary to demand ischemia in the face of A. fib with rapid ventricular response, relative anemia, cardiomyopathy and relative hypoxia.  We will continue to treat with rate control.  Not a candidate for heparin, aspirin or as mentioned above invasive evaluation.  Has peaked at 1636 thus far.  5.  Hypokalemia  -Continue with repletion.  It is improved today at 3.3.  We will continue to follow with a goal of greater than 4.  6.  Social circulation/living.  We will need to address patient's home situation.  Her lesion is obviously been present for quite some time.  She has not had any medical care recently per her report.  Patient and has multiple comorbidities and has a poor prognosis.  Consideration for palliative/hospice care could be raised.  We will  be happy to assist in discussing this with patient's family if desired.      Signed, Javier Docker Wong Steadham MD 04/20/2019, 7:34 AM  Pager: [phone]

## 2019-04-20 NOTE — TOC Progression Note (Signed)
Transition of Care [facility]) - Progression Note    Patient Details  Name: Kimberly Cantu MRN: [id number] Date of Birth: [birth year]  Transition of Care [facility]) CM/SW [place], RN Phone Number: 04/20/2019, 11:58 AM  Clinical Narrative:    Spoke with daughter, Kimberly Cantu, confirmed family is not interested in SNF placement. States patient will discharge either to her home with [facility] and family assistance or to the daughters home. Patient has a LTC policy that will assist in paying for paid caregivers. Strong family support and involvement with care with no wishes for SNF placement. TOC will follow for potential HHC needs at discharge.    Expected Discharge Plan: [place]    Expected Discharge Plan and Services Expected Discharge Plan: [place]       Living arrangements for the past 2 months: Single Family Home                                       Social Determinants of Health (SDOH) Interventions    Readmission Risk Interventions No flowsheet data found.

## 2019-04-20 NOTE — Progress Notes (Signed)
New referral for [facility] services at home received from Palliative NP Benito Mccreedy. ED CM Andreas Newport aware. Writer spoke via telephone to patient's daughter [name] to initiate education regarding hospice services, philosophy and team approach to care with understanding voiced. Patient will require DME to be in place prior to discharge, this includes oxygen and a BSC. DME ordered. [name] confirmed that her mother WILL be discharging to her own home, address verified as [address] [place] [address]. Hospice contact number given to [facility]. Patient information faxed to referral. Please call [phone] if patient is to discharge over the weekend.  Patient will need signed out of facility DNR at discharge. Thank you for the opportunity to be involved in the care of this patient. Flo Shanks BSN, RN, [facility] [employer] [phone]

## 2019-04-20 NOTE — Progress Notes (Signed)
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  After discussion with the hospitalist, Dr. Reesa Chew, and cardiologist, Dr. Ubaldo Glassing, we all agree that there is no benefit to the patient to perform a biopsy of the lesion, as she would be unlikely to tolerate definitive treatment.  General surgery will sign off.

## 2019-04-20 NOTE — Progress Notes (Signed)
Purewick changed.

## 2019-04-20 NOTE — Progress Notes (Signed)
Nurse call reporting tachy rhythm on mo itor . Bedside exam afib with RVR Metoprolol total of app 7-10 mg given as IV was infiltraed and additional dosing had to be given.  Pateint did convert to SR. Her pressures slightly low but with MAP above 65. She remained alert, confused slightly and had some hallucinations about pets going on but contribute her mental state to xanax given prior.  She was given 12.5 oral metoprolol after she converted. EKG showing minor ST T wave changes in anterior leads. Given her recent diagnosed heart failure history and her fungulating mass in addition to lack of regular medical care, troponin was ordered to help rule out significant ischemic event. It did result elevated at 1265.  She was not given aspirin or started on heparin given here severe anemia and planned intervention on mass. Dr. Ubaldo Glassing with cardiology was called and informed of the event as they are already following patient.  Metoproolol 12.5 BID ordered per his suggestion

## 2019-04-21 DIAGNOSIS — Z515 Encounter for palliative care: Secondary | ICD-10-CM

## 2019-04-21 LAB — COMPREHENSIVE METABOLIC PANEL
ALT: 27 U/L (ref 0–44)
AST: 30 U/L (ref 15–41)
Albumin: 2.5 g/dL — ABNORMAL LOW (ref 3.5–5.0)
Alkaline Phosphatase: 58 U/L (ref 38–126)
Anion gap: 10 (ref 5–15)
BUN: 23 mg/dL (ref 8–23)
CO2: 26 mmol/L (ref 22–32)
Calcium: 8.3 mg/dL — ABNORMAL LOW (ref 8.9–10.3)
Chloride: 100 mmol/L (ref 98–111)
Creatinine, Ser: 0.69 mg/dL (ref 0.44–1.00)
GFR calc Af Amer: >60 mL/min (ref >60)
GFR calc non Af Amer: >60 mL/min (ref >60)
Glucose, Bld: 236 mg/dL — ABNORMAL HIGH (ref 70–99)
Potassium: 3.4 mmol/L — ABNORMAL LOW (ref 3.5–5.1)
Sodium: 136 mmol/L (ref 135–145)
Total Bilirubin: 0.9 mg/dL (ref 0.3–1.2)
Total Protein: 7 g/dL (ref 6.5–8.1)

## 2019-04-21 LAB — CBC
HCT: 29.8 % — ABNORMAL LOW (ref 36.0–46.0)
Hemoglobin: 8.7 g/dL — ABNORMAL LOW (ref 12.0–15.0)
MCH: 21.1 pg — ABNORMAL LOW (ref 26.0–34.0)
MCHC: 29.2 g/dL — ABNORMAL LOW (ref 30.0–36.0)
MCV: 72.3 fL — ABNORMAL LOW (ref 80.0–100.0)
Platelets: 172 10*3/uL (ref 150–400)
RBC: 4.12 MIL/uL (ref 3.87–5.11)
RDW: 24.8 % — ABNORMAL HIGH (ref 11.5–15.5)
WBC: 11 10*3/uL — ABNORMAL HIGH (ref 4.0–10.5)
nRBC: 0.5 % — ABNORMAL HIGH (ref 0.0–0.2)

## 2019-04-21 LAB — MAGNESIUM: Magnesium: 2 mg/dL (ref 1.7–2.4)

## 2019-04-21 MED ORDER — POTASSIUM CHLORIDE CRYS ER 20 MEQ PO TBCR
20.0000 meq | EXTENDED_RELEASE_TABLET | Freq: Every day | ORAL | Status: DC
  Administered 2019-04-21: 20 meq via ORAL
  Filled 2019-04-21: qty 1

## 2019-04-21 MED ORDER — METOPROLOL SUCCINATE ER 25 MG PO TB24: 25.0000 mg | ORAL_TABLET | Freq: Every day | ORAL | 3 refills | Status: DC

## 2019-04-21 MED ORDER — ACETAMINOPHEN 325 MG PO TABS
ORAL_TABLET | ORAL | Status: AC
  Administered 2019-04-21: 650 mg via ORAL
  Filled 2019-04-21: qty 2

## 2019-04-21 MED ORDER — POLYETHYLENE GLYCOL 3350 17 G PO PACK: 17.0000 g | PACK | Freq: Every day | ORAL | 0 refills | Status: DC | PRN

## 2019-04-21 MED ORDER — FUROSEMIDE 10 MG/ML IJ SOLN
INTRAMUSCULAR | Status: AC
  Administered 2019-04-21: 40 mg via INTRAVENOUS
  Filled 2019-04-21: qty 4

## 2019-04-21 MED ORDER — POTASSIUM CHLORIDE CRYS ER 20 MEQ PO TBCR: 20.0000 meq | EXTENDED_RELEASE_TABLET | Freq: Every day | ORAL | 0 refills | Status: DC

## 2019-04-21 MED ORDER — FUROSEMIDE 40 MG PO TABS: 40.0000 mg | ORAL_TABLET | Freq: Every day | ORAL | 0 refills | Status: DC

## 2019-04-21 MED ORDER — FERROUS SULFATE 325 (65 FE) MG PO TABS: 325.0000 mg | ORAL_TABLET | Freq: Every day | ORAL | 3 refills | Status: AC

## 2019-04-21 MED ORDER — ADULT MULTIVITAMIN W/MINERALS CH: 1.0000 | ORAL_TABLET | Freq: Every day | ORAL | Status: DC

## 2019-04-21 MED ORDER — ALPRAZOLAM 0.5 MG PO TABS: 0.2500 mg | ORAL_TABLET | Freq: Three times a day (TID) | ORAL | 0 refills | Status: DC | PRN

## 2019-04-21 MED ORDER — FUROSEMIDE 40 MG PO TABS
40.0000 mg | ORAL_TABLET | Freq: Every day | ORAL | Status: DC
  Filled 2019-04-21: qty 1

## 2019-04-21 MED ORDER — METOPROLOL SUCCINATE ER 25 MG PO TB24
25.0000 mg | ORAL_TABLET | Freq: Every day | ORAL | Status: DC
  Administered 2019-04-21: 25 mg via ORAL
  Filled 2019-04-21: qty 1

## 2019-04-21 MED ORDER — SENNOSIDES-DOCUSATE SODIUM 8.6-50 MG PO TABS: 2.0000 | ORAL_TABLET | Freq: Every evening | ORAL | 2 refills | Status: DC | PRN

## 2019-04-21 MED ORDER — POTASSIUM CHLORIDE CRYS ER 20 MEQ PO TBCR
40.0000 meq | EXTENDED_RELEASE_TABLET | Freq: Once | ORAL | Status: AC
  Administered 2019-04-21: 40 meq via ORAL

## 2019-04-21 NOTE — Progress Notes (Signed)
Patient Name: Kimberly Cantu Date of Encounter: 04/21/2019  Hospital Problem List     Principal Problem:   Acute respiratory failure with hypoxia and hypercapnia (HCC) Active Problems:   Severe anemia   Acute systolic CHF (congestive heart failure) (HCC)   Mass of chest wall, right   Acute respiratory distress   Bilateral pleural effusion   Acute hypoxemic respiratory failure (HCC)   Protein-calorie malnutrition, severe   Elevated troponin level not due to acute coronary syndrome   Goals of care, counseling/discussion   Atrial fibrillation with RVR [facility])    Patient Profile     74 year old female with no significant past medical history who presented to the ED on 04/17/18 for worsening shortness of breath as well as a right upper chest wall mass. Workup in the ED was significant for oxygen saturation of 79% on room air, BNP of 1431, hemoglobin of 4.6, chest xray revealing pulmonary edema, and CT angio revealing moderate pleural effusions bilaterally with a right lobulated soft tissue mass in the supraclavicular region.  Echo showed severely reduced LV function globally with an EF around 25 to 30%.  Not felt to be candidate for any invasive cardiac evaluation and not candidate for surgical intervention in right clavicular chest mass.  Has been seen by palliative care.   Subjective   No complaints of chest pain.  Had transient A. fib last p.m. for 3 minutes.  Fontaine is converted on metoprolol.  Inpatient Medications    . feeding supplement (ENSURE ENLIVE)  237 mL Oral BID BM  . furosemide  40 mg Intravenous Q12H  . mouth rinse  15 mL Mouth Rinse BID  . metoprolol tartrate      . metoprolol tartrate  12.5 mg Oral BID  . multivitamin with minerals  1 tablet Oral Daily  . sodium chloride flush  3 mL Intravenous Q12H    Vital Signs    Vitals:   04/21/19 0415 04/21/19 0500 04/21/19 0515 04/21/19 0615  BP: (!) 94/48  90/60 (!) 97/55  Pulse: 77  80 80  Resp: 14  17 15    Temp:      TempSrc:      SpO2: 100%  98% 98%  Weight:  50.7 kg    Height:        Intake/Output Summary (Last 24 hours) at 04/21/2019 0803 Last data filed at 04/21/2019 0600 Gross per 24 hour  Intake 243 ml  Output 600 ml  Net -357 ml   Filed Weights   04/19/19 0632 04/20/19 0500 04/21/19 0500  Weight: 53.5 kg 49.3 kg 50.7 kg    Physical Exam    GEN: Well nourished, well developed, in no acute distress.  HEENT: normal.  Neck: Supple, no JVD, carotid bruits, or masses. Cardiac: RRR, no murmurs, rubs, or gallops. No clubbing, cyanosis, edema.  Radials/DP/PT 2+ and equal bilaterally.  Respiratory:  Respirations regular and unlabored, clear to auscultation bilaterally. GI: Soft, nontender, nondistended, BS + x 4. MS: no deformity or atrophy. Skin: warm and dry, no rash. Neuro:  Strength and sensation are intact. Psych: Normal affect.  Labs    CBC Recent Labs    04/18/19 1256 04/19/19 0437 04/19/19 2312 04/20/19 0553  WBC 9.3 13.1* 14.8* 13.3*  NEUTROABS 8.0* 11.5*  --   --   HGB 4.6* 7.9* 8.5* 8.2*  HCT 17.3* 26.0* 27.5* 26.9*  MCV 65.8* 68.4* 67.7* 69.0*  PLT 345 274 250 [id number]   Basic Metabolic Panel Recent Labs  04/19/19 2312 04/20/19 0553  NA 137 141  K 2.6* 3.3*  CL 101 101  CO2 25 27  GLUCOSE 145* 129*  BUN 30* 29*  CREATININE 0.71 0.71  CALCIUM 8.2* 8.4*  MG 2.1 2.0   Liver Function Tests Recent Labs    04/18/19 1256 04/20/19 0553  AST 66* 42*  ALT 38 34  ALKPHOS 58 56  BILITOT 1.0 1.2  PROT 7.6 6.7  ALBUMIN 2.9* 2.4*   No results for input(s): LIPASE, AMYLASE in the last 72 hours. Cardiac Enzymes No results for input(s): CKTOTAL, CKMB, CKMBINDEX, TROPONINI in the last 72 hours. BNP Recent Labs    04/18/19 1256  BNP 1,431.0*   D-Dimer No results for input(s): DDIMER in the last 72 hours. Hemoglobin A1C Recent Labs    04/19/19 0437  HGBA1C 6.0*   Fasting Lipid Panel Recent Labs    04/19/19 0437  CHOL 120  HDL 43  LDLCALC  69  TRIG 41  CHOLHDL 2.8   Thyroid Function Tests Recent Labs    04/19/19 0437  TSH 1.517    Telemetry    Currently sinus rhythm.  Had brief episodes of atrial fibrillation.  ECG    Sinus rhythm with no ischemia  Radiology    CT Angio Chest PE W and/or Wo Contrast  Result Date: 04/18/2019 CLINICAL DATA:  Shortness of breath, negative COVID-19 test in the ER today EXAM: CT ANGIOGRAPHY CHEST WITH CONTRAST TECHNIQUE: Multidetector CT imaging of the chest was performed using the standard protocol during bolus administration of intravenous contrast. Multiplanar CT image reconstructions and MIPs were obtained to evaluate the vascular anatomy. CONTRAST:  4mL OMNIPAQUE IOHEXOL 350 MG/ML SOLN IV COMPARISON:  None FINDINGS: Cardiovascular: Atherosclerotic calcifications of thoracic aorta, coronary arteries, and proximal great vessels. Aorta normal caliber. No aneurysm or dissection. Pulmonary arteries adequately opacified and patent. No evidence of pulmonary embolism. Heart unremarkable. No pericardial effusion. Mediastinum/Nodes: Esophagus unremarkable. Base of cervical region normal appearance. Enlarged precarinal lymph node 13 mm short axis. Probable enlarged subcarinal node 15 mm short axis. Few additional scattered upper normal sized mediastinal lymph nodes. Abnormal soft tissue at base of the RIGHT supraclavicular region, likely superficial lobulated mass, 4.5 x 2.6 x 2.4 cm image 5. Lungs/Pleura: Extensive BILATERAL mixed interstitial and airspace infiltrates which could represent pulmonary edema or multifocal infection. BILATERAL moderate pleural effusions. Mild compressive atelectasis of the posterior lower lobes. No pneumothorax or discrete pulmonary mass. Upper Abdomen: Visualized upper abdomen unremarkable Musculoskeletal: Diffuse osseous demineralization. Superior endplate compression deformity of L1 vertebral body. Review of the MIP images confirms the above findings. IMPRESSION: No  evidence of pulmonary embolism. Scattered atherosclerotic calcifications including coronary arteries. Extensive BILATERAL mixed interstitial and airspace infiltrates question multifocal pneumonia or pulmonary edema. Mildly prominent lymph nodes in mediastinum, nonspecific. BILATERAL moderate-sized pleural effusions. Lobulated superficial soft tissue mass superficially at the inferior RIGHT supraclavicular region, recommend correlation with physical exam to exclude tumor. Aortic Atherosclerosis (ICD10-I70.0). Findings called to Dr. Joni Fears on 04/18/2019 at 1501 hours. Electronically Signed   By: Lavonia Dana M.D.   On: 04/18/2019 15:03   DG CHEST PORT 1 VIEW  Result Date: 04/19/2019 CLINICAL DATA:  Dyspnea EXAM: PORTABLE CHEST 1 VIEW COMPARISON:  04/18/2019 FINDINGS: Normal heart size. Small bilateral pleural effusions. Diffuse bilateral interstitial and airspace densities are again identified and appear unchanged. No atelectasis or pneumothorax. IMPRESSION: 1. No change in aeration to the lungs compared with prior exam. 2. Stable small bilateral pleural effusions. Electronically Signed   By:  Kerby Moors M.D.   On: 04/19/2019 09:00   DG Chest Portable 1 View  Result Date: 04/18/2019 CLINICAL DATA:  Shortness of breath. Right upper chest mass. Sudden onset of shortness of breath. EXAM: PORTABLE CHEST 1 VIEW COMPARISON:  12/12/2005 FINDINGS: Artifact overlies the chest. Heart size is normal. There is abnormal interstitial and alveolar pulmonary density in a bat wing configuration suggesting acute interstitial and alveolar edema. Bronchopneumonia could have this appearance. Possible sub pulmonic pleural effusions. No discernible discrete mass lesion. No acute bone finding. IMPRESSION: Bilateral interstitial and alveolar pulmonary density in a perihilar pattern suggesting acute pulmonary edema. Bronchopneumonia could have this appearance. Suspicion of sub pulmonic pleural fluid. Electronically Signed   By: Nelson Chimes M.D.   On: 04/18/2019 13:16   ECHOCARDIOGRAM COMPLETE  Result Date: 04/19/2019   ECHOCARDIOGRAM REPORT   Patient Name:   QUANDA Cerro Date of Exam: 04/19/2019 Medical Rec #:  [id number]        Height:       63.0 in Accession #:    [id number]       Weight:       118.0 lb Date of Birth:  1946-03-02        BSA:          1.55 m Patient Age:    59 years         BP:           139/75 mmHg Patient Gender: F                HR:           102 bpm. Exam Location:  [facility] Procedure: 2D Echo, Color Doppler and Cardiac Doppler Indications:     Dyspnea 786.09  History:         Patient has no prior history of Echocardiogram examinations.                  CHF.  Sonographer:     Sherrie Sport RDCS (AE) Referring Phys:  [place] Diagnosing Phys: Bartholome Bill MD IMPRESSIONS  1. Left ventricular ejection fraction, by visual estimation, is 25 to 30%. The left ventricle has moderate to severely decreased function. Left ventricular septal wall thickness was normal. There is no left ventricular hypertrophy.  2. Moderately dilated left ventricular internal cavity size.  3. The left ventricle demonstrates global hypokinesis.  4. Global right ventricle has normal systolic function.The right ventricular size is mildly enlarged. No increase in right ventricular wall thickness.  5. Left atrial size was mildly dilated.  6. Right atrial size was mildly dilated.  7. Large pleural effusion in the left lateral region.  8. The mitral valve is grossly normal. Trivial mitral valve regurgitation.  9. The tricuspid valve is grossly normal. 10. The aortic valve was not well visualized. Aortic valve regurgitation is trivial. 11. The pulmonic valve was not well visualized. Pulmonic valve regurgitation is trivial. 12. The aortic root was not well visualized. FINDINGS  Left Ventricle: Left ventricular ejection fraction, by visual estimation, is 25 to 30%. The left ventricle has moderate to severely decreased function. The left ventricle  demonstrates global hypokinesis. The left ventricular internal cavity size was moderately dilated left ventricle. There is no left ventricular hypertrophy. Right Ventricle: The right ventricular size is mildly enlarged. No increase in right ventricular wall thickness. Global RV systolic function is has normal systolic function. Left Atrium: Left atrial size was mildly dilated. Right Atrium: Right atrial size was mildly  dilated Pericardium: There is no evidence of pericardial effusion. There is a large pleural effusion in the left lateral region. Mitral Valve: The mitral valve is grossly normal. Trivial mitral valve regurgitation. Tricuspid Valve: The tricuspid valve is grossly normal. Tricuspid valve regurgitation is mild. Aortic Valve: The aortic valve was not well visualized. Aortic valve regurgitation is trivial. Aortic valve mean gradient measures 2.5 mmHg. Aortic valve peak gradient measures 5.3 mmHg. Aortic valve area, by VTI measures 1.82 cm. Pulmonic Valve: The pulmonic valve was not well visualized. Pulmonic valve regurgitation is trivial. Pulmonic regurgitation is trivial. Aorta: The aortic root was not well visualized. IAS/Shunts: No atrial level shunt detected by color flow Doppler.  LEFT VENTRICLE PLAX 2D LVIDd:         4.86 cm       Diastology LVIDs:         4.17 cm       LV e' lateral: 5.66 cm/s LV PW:         0.91 cm       LV e' medial:  4.13 cm/s LV IVS:        1.18 cm LVOT diam:     1.90 cm LV SV:         33 ml LV SV Index:   21.66 LVOT Area:     2.84 cm  LV Volumes (MOD) LV area d, A2C:    43.00 cm LV area d, A4C:    34.10 cm LV area s, A2C:    35.80 cm LV area s, A4C:    29.50 cm LV major d, A2C:   8.95 cm LV major d, A4C:   7.31 cm LV major s, A2C:   8.52 cm LV major s, A4C:   7.45 cm LV vol d, MOD A2C: 171.0 ml LV vol d, MOD A4C: 128.0 ml LV vol s, MOD A2C: 124.0 ml LV vol s, MOD A4C: 93.6 ml LV SV MOD A2C:     47.0 ml LV SV MOD A4C:     128.0 ml LV SV MOD BP:      48.2 ml RIGHT VENTRICLE  TAPSE (M-mode): 2.7 cm LEFT ATRIUM             Index       RIGHT ATRIUM           Index LA diam:        3.40 cm 2.20 cm/m  RA Area:     12.00 cm LA Vol (A2C):   71.5 ml 46.27 ml/m RA Volume:   31.30 ml  20.26 ml/m LA Vol (A4C):   34.9 ml 22.59 ml/m LA Biplane Vol: 52.4 ml 33.91 ml/m  AORTIC VALVE                   PULMONIC VALVE AV Area (Vmax):    1.98 cm    PV Vmax:        0.62 m/s AV Area (Vmean):   2.04 cm    PV Peak grad:   1.5 mmHg AV Area (VTI):     1.82 cm    RVOT Peak grad: 2 mmHg AV Vmax:           115.50 cm/s AV Vmean:          67.200 cm/s AV VTI:            0.160 m AV Peak Grad:      5.3 mmHg AV Mean Grad:      2.5 mmHg  LVOT Vmax:         80.50 cm/s LVOT Vmean:        48.400 cm/s LVOT VTI:          0.103 m LVOT/AV VTI ratio: 0.64  AORTA Ao Root diam: 2.70 cm  SHUNTS Systemic VTI:  0.10 m Systemic Diam: 1.90 cm  Bartholome Bill MD Electronically signed by Bartholome Bill MD Signature Date/Time: 04/19/2019/10:49:28 AM    Final     Assessment & Plan    1. Acute HFrEF -Echo revealing an EF of 25%-30% with global hypokinesis no regional wall motion abnormality noted.  Etiology unclear.  Ischemic versus nonischemic cardiomyopathy.  Treatment will remain medical as she is not a candidate for any invasive evaluation or intervention based on comorbidities.  Now on metoprolol succinate tartrate 12.5 twice daily.  Will continue with metoprolol succinate at 25 mg daily based on CHF guidelines. -Continue to diuresis with Lasix at 40 mg p.o. daily.  Not a candidate currently due to soft blood pressure for afterload reduction.   -As patient remains chest pain free, will defer ischemic workup to an outpatient setting as she is not a candidate for left heart catheterization with severe anemia  -Still on high flow oxygen. Will likely need this as outpatient.   2. Right chest wall fungating mass  -Etiology unclear.  Biopsy is pending however  currently patient is deferring this.  Patient would be high risk from a cardiac standpoint for surgical intervention.    Lesion appears stable at present.  Would continue with dry dressing for now.  3.  Atrial fibrillation  -Patient developed atrial fibrillation with rapid ventricular response.  Has only had transient A. fib since that time on metoprolol tartrate at 12.5 twice daily.   Would continue with metoprolol tartrate at 12.5 mg twice daily and follow.  Patient had a brief run of A. fib earlier this morning.  Potassium was 2.7 on admission is improved to 3.3 yesterday Not a candidate for chronic anticoagulation due to comorbidities  4.  Abnormal troponin  -Elevated troponin last p.m.  Likely secondary to demand ischemia in the face of A. fib with rapid ventricular response, relative anemia, cardiomyopathy and relative hypoxia.  We will continue to treat with rate control.  Not a candidate for heparin, aspirin or as mentioned above invasive evaluation.  Has peaked at 1636.  5.  Hypokalemia  -Continue with repletion.  It is improved today at 3.3.  We will continue to follow with a goal of greater than 4.  We will continue with oral supplementation at 20 mEq daily while on p.o. Lasix.  6.  Home discharge planning.  Patient's daughters are very supportive.  They have discussed home situation with palliative/hospice care and the primary care team.  They plan to have her come home and care for their as long as they are able.  Will discuss with case management.  Signed, Javier Docker Daivd Fredericksen MD 04/21/2019, 8:03 AM  Pager: [phone]

## 2019-04-21 NOTE — Discharge Summary (Signed)
Physician Discharge Summary  Andera Ragain [id number] DOB: [birth year] DOA: 04/18/2019  PCP: Patient, No Pcp Per  Admit date: 04/18/2019 Discharge date: 04/21/2019  Admitted From: Home  Disposition:  Home with Hospice.   Recommendations for Outpatient Follow-up:  1. Follow up with PCP in 1-2 weeks 2. Please obtain BMP/CBC in one week your next doctors visit.   Home Health: Hospice Equipment/Devices:  Discharge Condition: Stable CODE STATUS: DNR Diet recommendation: Regular diet, comfort Feeding.   Brief/Interim Summary: 74 year old with no significant medical history presenting to the ER with shortness of breath and generalized weakness.  Overall she is a poor historian but started feeling unusual night prior to the admission.  Also reported of right chest wall mass which she noticed had progressively worsened over the course of the past 24 hours.  Noted to be hypoxic initially with severe anemia with hemoglobin of 4.6, elevated BNP of 1400.  Chest x-ray showing bilateral infiltrates.  CTA was negative for PE but showed bilateral moderate-sized pleural effusions.  Echocardiogram showed new diagnosis of congestive heart failure with reduced ejection fraction.  Cardiology team was consulted who made appropriate medication recommendations.  Case was discussed with general surgery and given her comorbidities,.  Incisional biopsy was placed on hold.  Palliative care team was consulted and was decided to transition patient to comfort care with hospice.  Arrangements were made.  I spoke extensively with the patient's daughter, son and the daughter-in-law regarding her care.  Generalized weakness/symptomatic anemia Severe iron deficiency anemia -Initial hemoglobin 4.6. Hb stable at 8.7. 2U PRBC -FOBT-pending. Iron Supp with Bowel Regimen.  -Seen by gastroenterology-defer work-up until hemodynamically more stable from respiratory standpoint.  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  Class IV -Echocardiogram -EF  25% -Procalcitonin negative -Bronchodilators, incentive spirometer and flutter valve -Daily Lasix with KCl Supp.  -Seen by cardiology-deferring ischemic work-up at this time but overall poor candidate.  Will focus on medical management. -LDL-69, TSH-normal, A1c- 6.0  A. fib with RVR, paroxysmal Elevated troponin -Aggressive repletion of electrolytes.  Not a good candidate for long-term anticoagulation -Continue metoprolol 12.5 mg twice daily  Right chest wall fungating mass -Incisional biopsy on hold given her chronic comorbid conditions.  Will transition to comfort care.  Moderate protein calorie malnutrition -Consult nutrition team  Goals of care -Extensive discussion about her chronic conditions and comorbidities, patient was transitioned to comfort care.  Seen by palliative care service.  Overall patient has poor prognosis.  Would suspect likely less than 6 months.  Discharge Diagnoses:  Principal Problem:   Acute respiratory failure with hypoxia and hypercapnia (HCC) Active Problems:   Severe anemia   Acute systolic CHF (congestive heart failure) (HCC)   Mass of chest wall, right   Acute respiratory distress   Bilateral pleural effusion   Acute hypoxemic respiratory failure (HCC)   Protein-calorie malnutrition, severe   Elevated troponin level not due to acute coronary syndrome   Goals of care, counseling/discussion   Atrial fibrillation with RVR [facility])   Hospice care patient    Consultations:  Palliative care  General surgery  Cardiology  Subjective: Seen and examined at bedside, remains on 8 L of oxygen this morning. No other complaints, she is in agreement to be transferred home with hospice.  I had extensive discussion over the phone with the patient's daughter, son and the daughter-in-law.   Active  Discharge Exam: Vitals:   04/21/19 1000  04/21/19 1039  BP: (!) 97/59 (!) 97/59  Pulse:  91  Resp:    Temp: 98.2 F (36.8 C)   SpO2:     Vitals:   04/21/19 0615 04/21/19 0800 04/21/19 1000 04/21/19 1039  BP: (!) 97/55 101/61 (!) 97/59 (!) 97/59  Pulse: 80   91  Resp: 15     Temp:  98.9 F (37.2 C) 98.2 F (36.8 C)   TempSrc:      SpO2: 98%     Weight:      Height:        General: Chronically ill and frail appearing.  10 L nasal cannula Cardiovascular: RRR, S1/S2 +, no rubs, no gallops Respiratory: CTA bilaterally, no wheezing, no rhonchi Abdominal: Soft, NT, ND, bowel sounds + Extremities: no edema, no cyanosis  Discharge Instructions   Allergies as of 04/21/2019   No Known Allergies     Medication List    TAKE these medications   ALPRAZolam 0.5 MG tablet Commonly known as: Xanax Take 0.5 tablets (0.25 mg total) by mouth 3 (three) times daily as needed for up to 30 doses for anxiety or sleep.   ferrous sulfate 325 (65 FE) MG tablet Take 1 tablet (325 mg total) by mouth daily.   furosemide 40 MG tablet Commonly known as: LASIX Take 1 tablet (40 mg total) by mouth daily.   metoprolol succinate 25 MG 24 hr tablet Commonly known as: TOPROL-XL Take 1 tablet (25 mg total) by mouth daily.   multivitamin with minerals Tabs tablet Take 1 tablet by mouth daily.   polyethylene glycol 17 g packet Commonly known as: MIRALAX / GLYCOLAX Take 17 g by mouth daily as needed for moderate constipation or severe constipation.   potassium chloride SA 20 MEQ tablet Commonly known as: KLOR-CON Take 1 tablet (20 mEq total) by mouth daily.   senna-docusate 8.6-50 MG tablet Commonly known as: Senokot-S Take 2 tablets by mouth at bedtime as needed for mild constipation or moderate constipation.      Follow-up Information    Teodoro Spray, MD Follow up in 1 week(s).   Specialty: Cardiology Contact information: [place]  [address] [phone]          No Known Allergies  You were cared for by a hospitalist during your hospital stay. If you have any questions about your discharge medications or the care you received while you were in the hospital after you are discharged, you can call the unit and asked to speak with the hospitalist on call if the hospitalist that took care of you is not available. Once you are discharged, your primary care physician will handle any further medical issues. Please note that no refills for any discharge medications will be authorized once you are discharged, as it is imperative that you return to your primary care physician (or establish a relationship with a primary care physician if you do not have one) for your aftercare needs so that they can reassess your need for medications and monitor your lab values.   Procedures/Studies: CT Angio Chest PE W and/or Wo Contrast  Result Date: 04/18/2019 CLINICAL DATA:  Shortness of breath, negative COVID-19 test in the ER today EXAM: CT ANGIOGRAPHY CHEST WITH CONTRAST TECHNIQUE: Multidetector CT imaging of the chest was performed using the standard protocol during bolus administration of intravenous contrast. Multiplanar CT image reconstructions and MIPs were obtained to evaluate the vascular anatomy. CONTRAST:  61mL OMNIPAQUE IOHEXOL 350 MG/ML SOLN IV COMPARISON:  None FINDINGS: Cardiovascular: Atherosclerotic calcifications of thoracic aorta, coronary arteries, and proximal great vessels. Aorta normal caliber. No aneurysm or dissection. Pulmonary arteries adequately opacified and patent. No evidence of pulmonary embolism. Heart unremarkable. No pericardial effusion. Mediastinum/Nodes: Esophagus unremarkable. Base of cervical region normal appearance. Enlarged precarinal lymph node 13 mm short axis. Probable enlarged subcarinal node 15 mm short axis. Few additional scattered upper normal sized mediastinal lymph nodes. Abnormal soft tissue at base of  the RIGHT supraclavicular region, likely superficial lobulated mass, 4.5 x 2.6 x 2.4 cm image 5. Lungs/Pleura: Extensive BILATERAL mixed interstitial and airspace infiltrates which could represent pulmonary edema or multifocal infection. BILATERAL moderate pleural effusions. Mild compressive atelectasis of the posterior lower lobes. No pneumothorax or discrete pulmonary mass. Upper Abdomen: Visualized upper abdomen unremarkable Musculoskeletal: Diffuse osseous demineralization. Superior endplate compression deformity of L1 vertebral body. Review of the MIP images confirms the above findings. IMPRESSION: No evidence of pulmonary embolism. Scattered atherosclerotic calcifications including coronary arteries. Extensive BILATERAL mixed interstitial and airspace infiltrates question multifocal pneumonia or pulmonary edema. Mildly prominent lymph nodes in mediastinum, nonspecific. BILATERAL moderate-sized pleural effusions. Lobulated superficial soft tissue mass superficially at the inferior RIGHT supraclavicular region, recommend correlation with physical exam to exclude tumor. Aortic Atherosclerosis (ICD10-I70.0). Findings called to Dr. Joni Fears on 04/18/2019 at 1501 hours. Electronically Signed   By: Lavonia Dana M.D.   On: 04/18/2019 15:03   DG CHEST PORT 1 VIEW  Result Date: 04/19/2019 CLINICAL DATA:  Dyspnea EXAM: PORTABLE CHEST 1 VIEW COMPARISON:  04/18/2019 FINDINGS: Normal heart size. Small bilateral pleural effusions. Diffuse bilateral interstitial and airspace densities are again identified and appear unchanged. No atelectasis or pneumothorax. IMPRESSION: 1. No change in aeration to the lungs compared with prior exam. 2. Stable small bilateral pleural effusions. Electronically Signed   By: Kerby Moors M.D.   On: 04/19/2019 09:00   DG Chest Portable 1 View  Result Date: 04/18/2019 CLINICAL DATA:  Shortness of breath. Right upper chest mass. Sudden onset of shortness of breath. EXAM: PORTABLE CHEST 1 VIEW  COMPARISON:  12/12/2005 FINDINGS: Artifact overlies the chest. Heart size is normal. There is abnormal interstitial and alveolar pulmonary density in a bat wing configuration suggesting acute interstitial and alveolar edema. Bronchopneumonia could have this appearance. Possible sub pulmonic pleural effusions. No discernible discrete mass lesion. No acute bone finding. IMPRESSION: Bilateral interstitial and alveolar pulmonary density in a perihilar pattern suggesting acute pulmonary edema. Bronchopneumonia could have this appearance. Suspicion of sub pulmonic pleural fluid. Electronically Signed   By: Nelson Chimes M.D.   On: 04/18/2019 13:16   ECHOCARDIOGRAM COMPLETE  Result Date: 04/19/2019   ECHOCARDIOGRAM REPORT   Patient Name:   [facility] Arduini Date of Exam: 04/19/2019 Medical Rec #:  [id number]        Height:       63.0 in Accession #:    [id number]       Weight:       118.0 lb Date of Birth:  03-13-1946        BSA:          1.55 m Patient Age:    64 years         BP:           139/75 mmHg Patient Gender: F                HR:           102 bpm. Exam Location:  [facility] Procedure: 2D Echo, Color Doppler and  Cardiac Doppler Indications:     Dyspnea 786.09  History:         Patient has no prior history of Echocardiogram examinations.                  CHF.  Sonographer:     Sherrie Sport RDCS (AE) Referring Phys:  [place] Diagnosing Phys: Bartholome Bill MD IMPRESSIONS  1. Left ventricular ejection fraction, by visual estimation, is 25 to 30%. The left ventricle has moderate to severely decreased function. Left ventricular septal wall thickness was normal. There is no left ventricular hypertrophy.  2. Moderately dilated left ventricular internal cavity size.  3. The left ventricle demonstrates global hypokinesis.  4. Global right ventricle has normal systolic function.The right ventricular size is mildly enlarged. No increase in right ventricular wall thickness.  5. Left atrial size was mildly dilated.  6. Right  atrial size was mildly dilated.  7. Large pleural effusion in the left lateral region.  8. The mitral valve is grossly normal. Trivial mitral valve regurgitation.  9. The tricuspid valve is grossly normal. 10. The aortic valve was not well visualized. Aortic valve regurgitation is trivial. 11. The pulmonic valve was not well visualized. Pulmonic valve regurgitation is trivial. 12. The aortic root was not well visualized. FINDINGS  Left Ventricle: Left ventricular ejection fraction, by visual estimation, is 25 to 30%. The left ventricle has moderate to severely decreased function. The left ventricle demonstrates global hypokinesis. The left ventricular internal cavity size was moderately dilated left ventricle. There is no left ventricular hypertrophy. Right Ventricle: The right ventricular size is mildly enlarged. No increase in right ventricular wall thickness. Global RV systolic function is has normal systolic function. Left Atrium: Left atrial size was mildly dilated. Right Atrium: Right atrial size was mildly dilated Pericardium: There is no evidence of pericardial effusion. There is a large pleural effusion in the left lateral region. Mitral Valve: The mitral valve is grossly normal. Trivial mitral valve regurgitation. Tricuspid Valve: The tricuspid valve is grossly normal. Tricuspid valve regurgitation is mild. Aortic Valve: The aortic valve was not well visualized. Aortic valve regurgitation is trivial. Aortic valve mean gradient measures 2.5 mmHg. Aortic valve peak gradient measures 5.3 mmHg. Aortic valve area, by VTI measures 1.82 cm. Pulmonic Valve: The pulmonic valve was not well visualized. Pulmonic valve regurgitation is trivial. Pulmonic regurgitation is trivial. Aorta: The aortic root was not well visualized. IAS/Shunts: No atrial level shunt detected by color flow Doppler.  LEFT VENTRICLE PLAX 2D LVIDd:         4.86 cm       Diastology LVIDs:         4.17 cm       LV e' lateral: 5.66 cm/s LV PW:          0.91 cm       LV e' medial:  4.13 cm/s LV IVS:        1.18 cm LVOT diam:     1.90 cm LV SV:         33 ml LV SV Index:   21.66 LVOT Area:     2.84 cm  LV Volumes (MOD) LV area d, A2C:    43.00 cm LV area d, A4C:    34.10 cm LV area s, A2C:    35.80 cm LV area s, A4C:    29.50 cm LV major d, A2C:   8.95 cm LV major d, A4C:   7.31 cm LV major s,  A2C:   8.52 cm LV major s, A4C:   7.45 cm LV vol d, MOD A2C: 171.0 ml LV vol d, MOD A4C: 128.0 ml LV vol s, MOD A2C: 124.0 ml LV vol s, MOD A4C: 93.6 ml LV SV MOD A2C:     47.0 ml LV SV MOD A4C:     128.0 ml LV SV MOD BP:      48.2 ml RIGHT VENTRICLE TAPSE (M-mode): 2.7 cm LEFT ATRIUM             Index       RIGHT ATRIUM           Index LA diam:        3.40 cm 2.20 cm/m  RA Area:     12.00 cm LA Vol (A2C):   71.5 ml 46.27 ml/m RA Volume:   31.30 ml  20.26 ml/m LA Vol (A4C):   34.9 ml 22.59 ml/m LA Biplane Vol: 52.4 ml 33.91 ml/m  AORTIC VALVE                   PULMONIC VALVE AV Area (Vmax):    1.98 cm    PV Vmax:        0.62 m/s AV Area (Vmean):   2.04 cm    PV Peak grad:   1.5 mmHg AV Area (VTI):     1.82 cm    RVOT Peak grad: 2 mmHg AV Vmax:           115.50 cm/s AV Vmean:          67.200 cm/s AV VTI:            0.160 m AV Peak Grad:      5.3 mmHg AV Mean Grad:      2.5 mmHg LVOT Vmax:         80.50 cm/s LVOT Vmean:        48.400 cm/s LVOT VTI:          0.103 m LVOT/AV VTI ratio: 0.64  AORTA Ao Root diam: 2.70 cm  SHUNTS Systemic VTI:  0.10 m Systemic Diam: 1.90 cm  Bartholome Bill MD Electronically signed by Bartholome Bill MD Signature Date/Time: 04/19/2019/10:49:28 AM    Final      The results of significant diagnostics from this hospitalization (including imaging, microbiology, ancillary and laboratory) are listed below for reference.     Microbiology: Recent Results (from the past 240 hour(s))  Respiratory Panel by RT PCR (Flu A&B, Covid) - Nasopharyngeal Swab     Status: None   Collection Time: 04/18/19  2:00 PM   Specimen: Nasopharyngeal Swab   Result Value Ref Range Status   SARS Coronavirus 2 by RT PCR NEGATIVE NEGATIVE Final    Comment: (NOTE) SARS-CoV-2 target nucleic acids are NOT DETECTED. The SARS-CoV-2 RNA is generally detectable in upper respiratoy specimens during the acute phase of infection. The lowest concentration of SARS-CoV-2 viral copies this assay can detect is 131 copies/mL. A negative result does not preclude SARS-Cov-2 infection and should not be used as the sole basis for treatment or other patient management decisions. A negative result may occur with  improper specimen collection/handling, submission of specimen other than nasopharyngeal swab, presence of viral mutation(s) within the areas targeted by this assay, and inadequate number of viral copies (<131 copies/mL). A negative result must be combined with clinical observations, patient history, and epidemiological information. The expected result is Negative. Fact Sheet for Patients:  PinkCheek.be Fact Sheet for  Healthcare Providers:  GravelBags.it This test is not yet ap proved or cleared by the [place] and  has been authorized for detection and/or diagnosis of SARS-CoV-2 by FDA under an Emergency Use Authorization (EUA). This EUA will remain  in effect (meaning this test can be used) for the duration of the COVID-19 declaration under Section 564(b)(1) of the Act, 21 U.S.C. section 360bbb-3(b)(1), unless the authorization is terminated or revoked sooner.    Influenza A by PCR NEGATIVE NEGATIVE Final   Influenza B by PCR NEGATIVE NEGATIVE Final    Comment: (NOTE) The Xpert Xpress SARS-CoV-2/FLU/RSV assay is intended as an aid in  the diagnosis of influenza from Nasopharyngeal swab specimens and  should not be used as a sole basis for treatment. Nasal washings and  aspirates are unacceptable for Xpert Xpress SARS-CoV-2/FLU/RSV  testing. Fact Sheet for  Patients: PinkCheek.be Fact Sheet for Healthcare Providers: GravelBags.it This test is not yet approved or cleared by the [place] FDA and  has been authorized for detection and/or diagnosis of SARS-CoV-2 by  FDA under an Emergency Use Authorization (EUA). This EUA will remain  in effect (meaning this test can be used) for the duration of the  Covid-19 declaration under Section 564(b)(1) of the Act, 21  U.S.C. section 360bbb-3(b)(1), unless the authorization is  terminated or revoked. Performed at [facility], [address] [place], [place] [address]      Labs: BNP (last 3 results) Recent Labs    04/18/19 1256  BNP 0000000*   Basic Metabolic Panel: Recent Labs  Lab 04/18/19 1256 04/19/19 0437 04/19/19 2312 04/20/19 0553 04/21/19 1043  NA 137 135 137 141 136  K 3.9 3.6 2.6* 3.3* 3.4*  CL 105 103 101 101 100  CO2 14* 23 25 27 26   GLUCOSE 139* 146* 145* 129* 236*  BUN 25* 33* 30* 29* 23  CREATININE 0.97 0.79 0.71 0.71 0.69  CALCIUM 8.6* 8.4* 8.2* 8.4* 8.3*  MG 2.2  --  2.1 2.0 2.0   Liver Function Tests: Recent Labs  Lab 04/18/19 1256 04/20/19 0553 04/21/19 1043  AST 66* 42* 30  ALT 38 34 27  ALKPHOS 58 56 58  BILITOT 1.0 1.2 0.9  PROT 7.6 6.7 7.0  ALBUMIN 2.9* 2.4* 2.5*   No results for input(s): LIPASE, AMYLASE in the last 168 hours. No results for input(s): AMMONIA in the last 168 hours. CBC: Recent Labs  Lab 04/18/19 1256 04/19/19 0437 04/19/19 2312 04/20/19 0553 04/21/19 1043  WBC 9.3 13.1* 14.8* 13.3* 11.0*  NEUTROABS 8.0* 11.5*  --   --   --   HGB 4.6* 7.9* 8.5* 8.2* 8.7*  HCT 17.3* 26.0* 27.5* 26.9* 29.8*  MCV 65.8* 68.4* 67.7* 69.0* 72.3*  PLT 345 274 250 229 172   Cardiac Enzymes: No results for input(s): CKTOTAL, CKMB, CKMBINDEX, TROPONINI in the last 168 hours. BNP: Invalid input(s): POCBNP CBG: No results for input(s): GLUCAP in the last 168  hours. D-Dimer No results for input(s): DDIMER in the last 72 hours. Hgb A1c Recent Labs    04/19/19 0437  HGBA1C 6.0*   Lipid Profile Recent Labs    04/19/19 0437  CHOL 120  HDL 43  LDLCALC 69  TRIG 41  CHOLHDL 2.8   Thyroid function studies Recent Labs    04/19/19 0437  TSH 1.517   Anemia work up Recent Labs    04/18/19 1256 04/18/19 1257  VITAMINB12  --  497  FERRITIN 5*  --  TIBC 430  --   IRON 10*  --    Urinalysis No results found for: COLORURINE, APPEARANCEUR, LABSPEC, PHURINE, GLUCOSEU, HGBUR, BILIRUBINUR, KETONESUR, PROTEINUR, UROBILINOGEN, NITRITE, LEUKOCYTESUR Sepsis Labs Invalid input(s): PROCALCITONIN,  WBC,  LACTICIDVEN Microbiology Recent Results (from the past 240 hour(s))  Respiratory Panel by RT PCR (Flu A&B, Covid) - Nasopharyngeal Swab     Status: None   Collection Time: 04/18/19  2:00 PM   Specimen: Nasopharyngeal Swab  Result Value Ref Range Status   SARS Coronavirus 2 by RT PCR NEGATIVE NEGATIVE Final    Comment: (NOTE) SARS-CoV-2 target nucleic acids are NOT DETECTED. The SARS-CoV-2 RNA is generally detectable in upper respiratoy specimens during the acute phase of infection. The lowest concentration of SARS-CoV-2 viral copies this assay can detect is 131 copies/mL. A negative result does not preclude SARS-Cov-2 infection and should not be used as the sole basis for treatment or other patient management decisions. A negative result may occur with  improper specimen collection/handling, submission of specimen other than nasopharyngeal swab, presence of viral mutation(s) within the areas targeted by this assay, and inadequate number of viral copies (<131 copies/mL). A negative result must be combined with clinical observations, patient history, and epidemiological information. The expected result is Negative. Fact Sheet for Patients:  PinkCheek.be Fact Sheet for Healthcare Providers:   GravelBags.it This test is not yet ap proved or cleared by the [place] FDA and  has been authorized for detection and/or diagnosis of SARS-CoV-2 by FDA under an Emergency Use Authorization (EUA). This EUA will remain  in effect (meaning this test can be used) for the duration of the COVID-19 declaration under Section 564(b)(1) of the Act, 21 U.S.C. section 360bbb-3(b)(1), unless the authorization is terminated or revoked sooner.    Influenza A by PCR NEGATIVE NEGATIVE Final   Influenza B by PCR NEGATIVE NEGATIVE Final    Comment: (NOTE) The Xpert Xpress SARS-CoV-2/FLU/RSV assay is intended as an aid in  the diagnosis of influenza from Nasopharyngeal swab specimens and  should not be used as a sole basis for treatment. Nasal washings and  aspirates are unacceptable for Xpert Xpress SARS-CoV-2/FLU/RSV  testing. Fact Sheet for Patients: PinkCheek.be Fact Sheet for Healthcare Providers: GravelBags.it This test is not yet approved or cleared by the [place] FDA and  has been authorized for detection and/or diagnosis of SARS-CoV-2 by  FDA under an Emergency Use Authorization (EUA). This EUA will remain  in effect (meaning this test can be used) for the duration of the  Covid-19 declaration under Section 564(b)(1) of the Act, 21  U.S.C. section 360bbb-3(b)(1), unless the authorization is  terminated or revoked. Performed at [facility], [address] [place], [place] [address]      Time coordinating discharge:  I have spent 35 minutes face to face with the patient and on the ward discussing the patients care, assessment, plan and disposition with other care givers. >50% of the time was devoted counseling the patient about the risks and benefits of treatment/Discharge disposition and coordinating care.   SIGNED:   Damita Lack, MD  [facility] 04/21/2019, 12:01  PM   If 7PM-7AM, please contact night-coverage

## 2019-04-21 NOTE — Progress Notes (Signed)
Purewick changed.

## 2019-04-21 NOTE — Progress Notes (Signed)
Pt alert and Ox3 , VSS Dr Ubaldo Glassing and Dr Gerlean Ren in with pt this Am. Spoke to Marshell Garfinkel, Tourist information centre manager. Pt will be discharged today with daughter, [name]. Pt will go home with oxygen. D/C instructions given to daughter [name], verbalized understanding of instructions. Right clavicle dressing changed with moderate amt of yellowish  Drainage.

## 2019-04-21 NOTE — TOC Transition Note (Addendum)
Transition of Care [facility]) - CM/SW Discharge Note   Patient Details  Name: Irvin Hibbitt MRN: [id number] Date of Birth: 1945-09-13  Transition of Care [facility]) CM/SW Contact:  Marshell Garfinkel, RN Phone Number: 04/21/2019, 9:57 AM   Clinical Narrative:    TOC spoke with patient's daughter [name] by phone [id number] and [facility] through [facility] delivered 3 in 1 toilet along with O2 however "the rep could not get one of the tanks to work and is supposed to replace it".  [name] is going to call [facility] to let them know the MD is planning to transition patient to her own home, address verified as [place] [address] today. [name] said patient can ride in car and will not need EMS. I have paged hospice nurse to arrange [phone]. Update at 1002: [name] called TOC back to let me know that O2 will be delivered to home between 1230-1300 today. RN and MD updated. Received callback from April with [facility] [phone] stating that she will reach out to [place] to make home visit arrangement as it may not be today.    Final next level of care: Home w Hospice Care Barriers to Discharge: Equipment Delay([facility] O2 malfunction/delivered to home last night per daughter [name])   Patient Goals and CMS Choice Patient states their goals for this hospitalization and ongoing recovery are:: return to daughter's home with [facility] care hospice today CMS Medicare.gov Compare Post Acute Care list provided to:: Patient Represenative (must comment)(ALicia daughter [id number]) Choice offered to / list presented to : Adult Children  Discharge Placement                       Discharge Plan and Services                DME Arranged: 3-N-1, Oxygen(hospice/[facility]) DME Agency: [facility] Date DME Agency Contacted: 04/21/19 Time DME Agency Contacted: [phone] Representative spoke with at DME Agency: Apolonio Schneiders with [facility]            Social Determinants of Health (SDOH) Interventions     Readmission Risk Interventions No flowsheet data found.

## 2019-04-23 SURGERY — EXCISION MASS
Anesthesia: General | Laterality: Right

## 2019-04-23 NOTE — Anesthesia Postprocedure Evaluation (Signed)
Anesthesia Post Note  Patient: Maelle Cort  Procedure(s) Performed: EXCISION MASS RIGHT SHOULDER (canceled)  Patient location during evaluation: Mother Baby Anesthesia Type: Epidural Level of consciousness: awake and alert and oriented Pain management: pain level controlled Vital Signs Assessment: post-procedure vital signs reviewed and stable Respiratory status: spontaneous breathing Cardiovascular status: blood pressure returned to baseline Anesthetic complications: no Comments: Patient discharged prior to anesthetic visit.  No apparent anesthetic issues per nursing staff.     Last Vitals: There were no vitals filed for this visit.  Last Pain: There were no vitals filed for this visit.               Shaylynne Lunt

## 2020-12-23 ENCOUNTER — Telehealth: Payer: Self-pay | Admitting: Student

## 2020-12-23 NOTE — Telephone Encounter (Signed)
Palliative NP spoke with patient's daughter. She is having a procedure in [place] today. NP will see patient for palliative consult tomorrow at [place].

## 2020-12-24 ENCOUNTER — Non-Acute Institutional Stay: Payer: Medicare Other | Admitting: Student

## 2020-12-24 ENCOUNTER — Other Ambulatory Visit: Payer: Self-pay | Admitting: Student

## 2020-12-24 ENCOUNTER — Other Ambulatory Visit: Payer: Self-pay

## 2020-12-24 DIAGNOSIS — R52 Pain, unspecified: Secondary | ICD-10-CM

## 2020-12-24 DIAGNOSIS — C44611 Basal cell carcinoma of skin of unspecified upper limb, including shoulder: Secondary | ICD-10-CM

## 2020-12-24 DIAGNOSIS — Z515 Encounter for palliative care: Secondary | ICD-10-CM

## 2020-12-24 DIAGNOSIS — F32A Depression, unspecified: Secondary | ICD-10-CM

## 2020-12-24 DIAGNOSIS — F0390 Unspecified dementia without behavioral disturbance: Secondary | ICD-10-CM

## 2020-12-29 NOTE — Progress Notes (Signed)
Therapist, nutritional Palliative Care Consult Note Telephone: [phone]  Fax: [phone]   Date of encounter: 12/24/2020  PATIENT NAME: Kimberly Cantu [address] [place] [address]   [phone] (home)  DOB: 1945-06-22 MRN: [id number] PRIMARY CARE PROVIDER:    Dr. Enid Baas   REFERRING PROVIDER:   Dr. Enid Baas   RESPONSIBLE PARTY:    Contact Information     Name Relation Home Work Mobile   Crissie Figures Daughter   [phone]   marysa, wessner [phone]  [phone]        I met face to face with patient in the facility. Palliative Care was asked to follow this patient by consultation request of  Dr.Kalisetti to address advance care planning and complex medical decision making. This is the initial visit.   Attempted to reach daughter to provide update on today's visit. Awaiting return call.                                   ASSESSMENT AND PLAN / RECOMMENDATIONS:   Advance Care Planning/Goals of Care: Goals include to maximize quality of life and symptom management. Patient/health care surrogate gave his/her permission to discuss.Our advance care planning conversation included a discussion about:    The value and importance of advance care planning  Experiences with loved ones who have been seriously ill or have died  Exploration of personal, cultural or spiritual beliefs that might influence medical decisions  Exploration of goals of care in the event of a sudden injury or illness  CODE STATUS: DNR  Education provided on Palliative Medicine. Will continue to provide support; will monitor for changes and declines. Will refer for hospice evaluation when she meets criteria.   Symptom Management/Plan:  Pain-status post Mohs surgery to right clavicle BCC yesterday. Continue acetaminophen PRN pain.  Dementia-patient does require cueing and reminders. Continue Aricept as directed.  Patient resides at  assisted living; staff to continue assisting with ADLs as needed.  Monitor for falls/safety.  Encourage use of walker for ambulation. Follow up with neurology as scheduled.   Mood-patient recently moved to facility; adjusting to new environment. Continue sertraline as directed for her depression. Monitor for effectiveness.   BCC-patient plans to seek aggressive treatment at this time.   Follow up Palliative Care Visit: Palliative care will continue to follow for complex medical decision making, advance care planning, and clarification of goals. Return in 8 weeks or prn.  I spent 30 minutes providing this consultation. More than 50% of the time in this consultation was spent in counseling and care coordination.   PPS: 50%  HOSPICE ELIGIBILITY/DIAGNOSIS: TBD  Chief Complaint: Palliative Medicine initial consult.  HISTORY OF PRESENT ILLNESS:  Dion Parrow is a 75 y.o. year old female  with diagnoses of dementia, depression, anxiety, basal cell carcinoma, paroxysmal atrial fibrillation, CHF, protein calorie malnutrition.  Patient is status post Mohs surgery yesterday to right clavicle. Patient was recently evaluated by hospice, but wishes to continue oral chemotherapy medication.   Patient resides at the [facility] assisted living. Patient recently moved to facility after home situation. Patient does require some assistance with ADLs. She uses walker for ambulation.She states that her mood has been better, and is beginning to adjust to facility.Patient was recently started on Zoloft.  She denies any pain, shortness of breath, nausea, constipation. She endorses a good appetite. She states she is sleeping well  at night. A 10-point review of systems is negative, except for the pertinent positives and negatives detailed in the HPI.    History obtained from review of EMR, discussion with primary team, and interview with family, facility staff/caregiver and/or Ms. Furnish.  I  reviewed available labs, medications, imaging, studies and related documents from the EMR.  Records reviewed and summarized above.   Physical Exam: Pulse 68, resp 16, sats 97% on room air Constitutional: NAD General: frail appearing, thin EYES: anicteric sclera, lids intact, no discharge  ENMT: intact hearing, oral mucous membranes moist, dentition intact CV: S1S2, RRR, no LE edema Pulmonary: LCTA, no increased work of breathing, no cough Abdomen:  normo-active BS + 4 quadrants, soft and non tender GU: deferred MSK: moves all extremities, ambulatory with walker Skin: warm and dry, no rashes; dressing to right clavicle. Neuro: generalized weakness,  A & O x 2, forgetful Psych: non-anxious affect, pleasant Hem/lymph/immuno: no widespread bruising CURRENT PROBLEM LIST:  Patient Active Problem List   Diagnosis Date Noted   Hospice care patient 04/21/2019   Elevated troponin level not due to acute coronary syndrome 04/20/2019   Goals of care, counseling/discussion 04/20/2019   Atrial fibrillation with RVR ([place]) 16/94/5038   Acute systolic CHF (congestive heart failure) ([place]) 04/19/2019   Mass of chest wall, right 04/19/2019   Acute respiratory distress 04/19/2019   Acute respiratory failure with hypoxia and hypercapnia ([place]) 04/19/2019   Bilateral pleural effusion 04/19/2019   Acute hypoxemic respiratory failure ([place]) 04/19/2019   Protein-calorie malnutrition, severe 04/19/2019   Severe anemia 04/18/2019   PAST MEDICAL HISTORY:  Active Ambulatory Problems    Diagnosis Date Noted   Severe anemia 88/28/0034   Acute systolic CHF (congestive heart failure) ([place]) 04/19/2019   Mass of chest wall, right 04/19/2019   Acute respiratory distress 04/19/2019   Acute respiratory failure with hypoxia and hypercapnia ([facility]) 04/19/2019   Bilateral pleural effusion 04/19/2019   Acute hypoxemic respiratory failure ([place]) 04/19/2019   Protein-calorie malnutrition, severe 04/19/2019   Elevated  troponin level not due to acute coronary syndrome 04/20/2019   Goals of care, counseling/discussion 04/20/2019   Atrial fibrillation with RVR ([place]) 04/20/2019   Hospice care patient 04/21/2019   Resolved Ambulatory Problems    Diagnosis Date Noted   No Resolved Ambulatory Problems   Past Medical History:  Diagnosis Date   Anemia    CHF (congestive heart failure) ([place])    SOCIAL HX:  Social History   Tobacco Use   Smoking status: Former   Smokeless tobacco: Never  Substance Use Topics   Alcohol use: Never   FAMILY HX: No family history on file.    ALLERGIES: No Known Allergies   PERTINENT MEDICATIONS:  Outpatient Encounter Medications as of 12/24/2020  Medication Sig   ALPRAZolam (XANAX) 0.5 MG tablet Take 0.5 tablets (0.25 mg total) by mouth 3 (three) times daily as needed for up to 30 doses for anxiety or sleep.   furosemide (LASIX) 40 MG tablet Take 1 tablet (40 mg total) by mouth daily.   metoprolol succinate (TOPROL-XL) 25 MG 24 hr tablet Take 1 tablet (25 mg total) by mouth daily.   Multiple Vitamin (MULTIVITAMIN WITH MINERALS) TABS tablet Take 1 tablet by mouth daily.   polyethylene glycol (MIRALAX / GLYCOLAX) 17 g packet Take 17 g by mouth daily as needed for moderate constipation or severe constipation.   potassium chloride SA (KLOR-CON) 20 MEQ tablet Take 1 tablet (20 mEq total) by mouth daily.   senna-docusate (SENOKOT-S)  8.6-50 MG tablet Take 2 tablets by mouth at bedtime as needed for mild constipation or moderate constipation.   No facility-administered encounter medications on file as of 12/24/2020.   Thank you for the opportunity to participate in the care of Ms. Mapel.  The palliative care team will continue to follow. Please call our office at [phone] if we can be of additional assistance.   Ezekiel Slocumb, NP   COVID-19 PATIENT SCREENING TOOL Asked and negative response unless otherwise noted:  Have you had symptoms of covid, tested positive or  been in contact with someone with symptoms/positive test in the past 5-10 days? No

## 2021-02-05 ENCOUNTER — Other Ambulatory Visit: Payer: Self-pay | Admitting: Internal Medicine

## 2021-02-05 DIAGNOSIS — R600 Localized edema: Secondary | ICD-10-CM

## 2021-04-15 ENCOUNTER — Other Ambulatory Visit: Payer: Self-pay

## 2021-04-15 ENCOUNTER — Non-Acute Institutional Stay: Payer: Medicare Other | Admitting: Student

## 2021-04-15 DIAGNOSIS — Z515 Encounter for palliative care: Secondary | ICD-10-CM

## 2021-04-15 DIAGNOSIS — F32A Depression, unspecified: Secondary | ICD-10-CM

## 2021-04-15 DIAGNOSIS — F03A3 Unspecified dementia, mild, with mood disturbance: Secondary | ICD-10-CM

## 2021-04-15 NOTE — Progress Notes (Signed)
Designer, jewellery Palliative Care Consult Note Telephone: [phone]  Fax: [phone]    Date of encounter: 04/15/21 3:46 PM PATIENT NAME: Kimberly Cantu [address] [place] [address]   [phone] (home)  DOB: 06/02/1945 MRN: [id number] PRIMARY CARE PROVIDER:    Dr. Gladstone Lighter  REFERRING PROVIDER:   Dr. Gladstone Lighter  RESPONSIBLE PARTY:    Contact Information     Name Relation Home Work Mobile   Kimberly Cantu Daughter   [phone]   vetra, shinall [phone]  [phone]        I met face to face with patient in the facility. Palliative Care was asked to follow this patient by consultation request of  Dr.Kalisetti to address advance care planning and complex medical decision making. This is a follow up visit.                                   ASSESSMENT AND PLAN / RECOMMENDATIONS:   Advance Care Planning/Goals of Care: Goals include to maximize quality of life and symptom management.   CODE STATUS: DNR  Symptom Management/Plan:  Dementia- patient requires cueing and reminders. Continue Aricept as directed. Staff to continue assisting with ADLs as needed.  Monitor for falls/safety. Encourage use of walker for ambulation. Follow up with neurology as scheduled.    Depression-patient's sertraline recently increased. Crying episodes have improved. Will continue sertraline at 50 mg daily.   Follow up Palliative Care Visit: Palliative care will continue to follow for complex medical decision making, advance care planning, and clarification of goals. Return in 12 weeks or prn.  This visit was coded based on medical decision making (MDM).  PPS: 50%  HOSPICE ELIGIBILITY/DIAGNOSIS: TBD  Chief Complaint: Palliative Medicine follow up visit.   HISTORY OF PRESENT ILLNESS:  Kimberly Cantu is a 76 y.o. year old female  with dementia, depression, anxiety, basal cell carcinoma, paroxysmal atrial fibrillation,  CHF, protein calorie malnutrition.   Patient resides at the [facility] living. Facility staff deny any acute needs. Staff report patient having increased crying episodes, more depressed. Her Zoloft was increased to 50 mg and staff reports crying and mood has improved. She denies any pain, shortness of breath, nausea, constipation. She endorses a fair appetite, but states she eats what she wants. She states she is sleeping well at night. She does have forgetfulness noted. A 10-point review of systems is negative, except for the pertinent positives and negatives detailed in the HPI.   History obtained from review of EMR, discussion with primary team, and interview with family, facility staff/caregiver and/or Kimberly Cantu.  I reviewed available labs, medications, imaging, studies and related documents from the EMR.  Records reviewed and summarized above.    Physical Exam: Pulse 64, resp 16, sats 95% on room air Constitutional: NAD General: frail appearing, thin EYES: anicteric sclera, lids intact, no discharge  ENMT: intact hearing, oral mucous membranes moist, dentition intact CV: S1S2, RRR, no LE edema Pulmonary: LCTA, no increased work of breathing, no cough, room air Abdomen: normo-active BS + 4 quadrants, soft and non tender, no ascites GU: deferred MSK: moves all extremities, ambulatory Skin: warm and dry, no rashes or wounds on visible skin Neuro:  no generalized weakness, A & O x 2, forgetful Psych: non-anxious affect, pleasant Hem/lymph/immuno: no widespread bruising   Thank you for the opportunity to participate in the care of Kimberly Cantu.  The palliative care team will continue to follow. Please call our office at [phone] if we can be of additional assistance.   Ezekiel Slocumb, NP   COVID-19 PATIENT SCREENING TOOL Asked and negative response unless otherwise noted:   Have you had symptoms of covid, tested positive or been in contact with someone  with symptoms/positive test in the past 5-10 days? No

## 2021-08-04 ENCOUNTER — Other Ambulatory Visit: Payer: Self-pay | Admitting: Internal Medicine

## 2021-08-04 DIAGNOSIS — R600 Localized edema: Secondary | ICD-10-CM

## 2021-08-11 ENCOUNTER — Non-Acute Institutional Stay: Payer: Medicare Other | Admitting: Student

## 2021-08-11 DIAGNOSIS — F32A Depression, unspecified: Secondary | ICD-10-CM

## 2021-08-11 DIAGNOSIS — F0393 Unspecified dementia, unspecified severity, with mood disturbance: Secondary | ICD-10-CM

## 2021-08-11 DIAGNOSIS — Z515 Encounter for palliative care: Secondary | ICD-10-CM

## 2021-08-11 DIAGNOSIS — R600 Localized edema: Secondary | ICD-10-CM

## 2021-08-11 NOTE — Progress Notes (Signed)
? ? ?Manufacturing engineer ?Community Palliative Care Consult Note ?Telephone: [phone]  ?Fax: [phone]  ? ? ?Date of encounter: 08/11/21 ? ?PATIENT NAME: Kimberly Cantu ?[place] [address]   [phone] (home)  ?DOB: 12/23/1945 ?MRN: [id number] ?PRIMARY CARE PROVIDER:    ?Patient, No Pcp Per (Inactive),  ?No address on file ?None ? ?REFERRING PROVIDER:   ?Dr. Gladstone Lighter ? ?RESPONSIBLE PARTY:    ?Contact Information   ? ? Name Relation Home Work Mobile  ? Georjean Mode Daughter   [phone]  ? maralee, higuchi [phone]  [phone]  ? ?  ? ? ? ?I met face to face with patient in the facility. Palliative Care was asked to follow this patient by consultation request of  Dr. Gladstone Lighter to address advance care planning and complex medical decision making. This is a follow up visit. ? ?                                 ASSESSMENT AND PLAN / RECOMMENDATIONS:  ? ?Advance Care Planning/Goals of Care: Goals include to maximize quality of life and symptom management. Patient/health care surrogate gave his/her permission to discuss. ?CODE STATUS: DNR ? ?Symptom Management/Plan: ? ?Dementia-reorient and redirect as needed. Monitor for falls/safety; use walker for ambulation. Staff to assist with adl's as needed. Continue Aricept as directed. Follow up with neurology as scheduled.  ? ?Depression-endorses stable mood; continue sertraline 50 mg QHS as directed.  ? ?LE edema-patient with worsening LE edema, R > L. Patient is to have [place]. Continue furosemide 40 mg daily, elevate legs.  ? ?Follow up Palliative Care Visit: Palliative care will continue to follow for complex medical decision making, advance care planning, and clarification of goals. Return in 8 weeks or prn. ? ? ?This visit was coded based on medical decision making (MDM). ? ?PPS: 50% ? ?HOSPICE ELIGIBILITY/DIAGNOSIS: TBD ? ?Chief Complaint: Palliative Medicine follow up visit.  ? ?HISTORY OF PRESENT  ILLNESS:  Kimberly Cantu is a 76 y.o. year old female  with dementia, depression, anxiety, basal cell carcinoma, paroxysmal atrial fibrillation, CHF, dilated cardiomyopathy, chronic anemia, protein calorie malnutrition.   ? ?Patient resides at the [facility]. Patient denies pain, shortness of breath, nausea, constipation. She does endorse worsening LE edema; she states her right leg has been swelling for the past 8 years. She was seen by PCP and to have an [place] to r/o blood clot. She endorses a fair appetite; states she is eating foods she enjoys. She is sleeping well; endorses good mood. No crying episodes per staff. A 10-point review of systems is negative, except for the pertinent positives and negatives detailed in the HPI.  ? ?History obtained from review of EMR, discussion with primary team, and interview with family, facility staff/caregiver and/or Ms. Compston.  ?I reviewed available labs, medications, imaging, studies and related documents from the EMR.  Records reviewed and summarized above.  ? ? ?Physical Exam: ?Pulse 60, resp 16, b/p 120/70, sats 96% on room air ?Constitutional: NAD ?General: frail appearing, thin ?EYES: anicteric sclera, lids intact, no discharge  ?ENMT: intact hearing, oral mucous membranes moist, dentition intact ?CV: S1S2, RRR, 1+ LE edema R > L ?Pulmonary: LCTA, no increased work of breathing, no cough, room air ?Abdomen: normo-active BS + 4 quadrants, soft and non tender, no ascites ?GU: deferred ?MSK: moves all extremities, ambulatory with walker ?Skin: warm and dry,  no rashes or wounds on visible skin ?Neuro:  no generalized weakness, A & O x 2, forgetful ?Psych: non-anxious affect, pleasant ?Hem/lymph/immuno: no widespread bruising ? ? ?Thank you for the opportunity to participate in the care of Ms. Trotta.  The palliative care team will continue to follow. Please call our office at [phone] if we can be of additional assistance.  ? ?Ezekiel Slocumb, NP  ? ?COVID-19 PATIENT SCREENING TOOL ?Asked and negative response unless otherwise noted:  ? ?Have you had symptoms of covid, tested positive or been in contact with someone with symptoms/positive test in the past 5-10 days? No ? ?

## 2021-08-12 ENCOUNTER — Ambulatory Visit
Admission: RE | Admit: 2021-08-12 | Discharge: 2021-08-12 | Disposition: A | Discharge status: Home / Self Care | Payer: Medicare Other | Source: Ambulatory Visit | Attending: Internal Medicine | Admitting: Internal Medicine

## 2021-08-12 DIAGNOSIS — R600 Localized edema: Secondary | ICD-10-CM | POA: Insufficient documentation

## 2021-11-19 ENCOUNTER — Other Ambulatory Visit: Payer: Self-pay | Admitting: Physician Assistant

## 2021-11-19 DIAGNOSIS — R413 Other amnesia: Secondary | ICD-10-CM

## 2021-11-26 ENCOUNTER — Ambulatory Visit: Payer: Medicare Other

## 2022-03-05 ENCOUNTER — Non-Acute Institutional Stay: Payer: Medicare Other | Admitting: Student

## 2022-03-05 DIAGNOSIS — F32A Depression, unspecified: Secondary | ICD-10-CM

## 2022-03-05 DIAGNOSIS — F0393 Unspecified dementia, unspecified severity, with mood disturbance: Secondary | ICD-10-CM

## 2022-03-05 DIAGNOSIS — Z515 Encounter for palliative care: Secondary | ICD-10-CM

## 2022-03-06 NOTE — Progress Notes (Signed)
Kimberly Cantu Consult Note Telephone: [phone]  Fax: [phone]    Date of encounter: 03/05/22  PATIENT NAME: Kimberly Cantu [place] [address]   [phone] (home)  DOB: 1945-10-22 MRN: [id number] PRIMARY CARE PROVIDER:    Gladstone Lighter, MD,  [place] [address] [phone]  REFERRING PROVIDER:   Gladstone Cantu, [place],  [place] [address] [phone]  RESPONSIBLE PARTY:    Contact Information     Name Relation Home Work Kimberly Cantu Daughter   [phone]   Kimberly Cantu, Kimberly Cantu [phone]  [phone]        I met face to face with patient in the facility. Palliative Care was asked to follow this patient by consultation request of  Kimberly Lighter, MD to address advance care planning and complex medical decision making. This is a follow up visit.                                   ASSESSMENT AND PLAN / RECOMMENDATIONS:   Advance Care Planning/Goals of Care: Goals include to maximize quality of life and symptom management. Patient/health care surrogate gave his/her permission to discuss.  CODE STATUS: DNR  Symptom Management/Plan:  Dementia-Patient still expresses wanting to return home. Staff to reorient, redirect as needed. Assist with adl's as needed. Monitor for falls/safety; use walker for ambulation. continue Aricept 10 mg QHS as directed.   Depression, anxiety-patient states her mood has been stable. Staff report less crying episodes, although she has periods where she feels down or sad. Continue Zoloft as directed.   Follow up Palliative Care Visit: Palliative care will continue to follow for complex medical decision making, advance care planning, and clarification of goals. Return in 8 weeks or prn.  This visit was coded based on medical decision making (MDM).  PPS: 50%  HOSPICE ELIGIBILITY/DIAGNOSIS:  TBD  Chief Complaint: Palliative Medicine follow up visit.   HISTORY OF PRESENT ILLNESS:  Kimberly Cantu is a 76 y.o. year old female  with ementia, depression, anxiety, basal cell carcinoma, paroxysmal atrial fibrillation, CHF, dilated cardiomyopathy, chronic anemia, protein calorie malnutrition, OA.   Patient resides at [facility] [place]. She reports doing well. She denies pain at present. She was seen by orthopedics on 02/19/22 due to left hip pain, OA of right knee. X-ray showed severe post-traumatic arthritis, avascular necrosis of the femoral head, significant erosion of acetabulum and bone loss. Decision made not to pursue surgery to left hip; no further intervention of right knee given degree of pain. Patient states she has been eating well. No recent falls. Patient observed ambulating with walker; antalgic gait. Pleasant affect. Patient does express wanting to return to her home soon.   History obtained from review of EMR, discussion with primary team, and interview with family, facility staff/caregiver and/or Kimberly Cantu.  I reviewed available labs, medications, imaging, studies and related documents from the EMR.  Records reviewed and summarized above.   ROS  A 10-point review of systems is negative, except for the pertinent positives and negatives detailed in the HPI.    Physical Exam: Weight: 111 pounds Constitutional: NAD General: frail appearing, thin EYES: anicteric sclera, lids intact, no discharge  ENMT: intact hearing, oral mucous membranes moist, dentition intact CV: S1S2, RRR, 1+ LE edema Pulmonary: LCTA, no increased work of breathing, no cough, room air Abdomen: normo-active  BS + 4 quadrants, soft and non tender, no ascites GU: deferred MSK: moves all extremities, ambulatory with walker Skin: warm and dry, no rashes or wounds on visible skin Neuro:  no generalized weakness, + cognitive impairment Psych: non-anxious affect, A and O x 2 Hem/lymph/immuno:  no widespread bruising   Thank you for the opportunity to participate in the care of Kimberly Cantu. Please call our office at [phone] if we can be of additional assistance.   Ezekiel Slocumb, NP   COVID-19 PATIENT SCREENING TOOL Asked and negative response unless otherwise noted:   Have you had symptoms of covid, tested positive or been in contact with someone with symptoms/positive test in the past 5-10 days? No

## 2022-03-28 ENCOUNTER — Observation Stay
Admission: EM | Admit: 2022-03-28 | Discharge: 2022-03-29 | Disposition: A | Discharge status: Skilled Nursing Facility | Payer: Medicare Other | Attending: Emergency Medicine | Admitting: Emergency Medicine

## 2022-03-28 ENCOUNTER — Emergency Department: Payer: Medicare Other

## 2022-03-28 ENCOUNTER — Other Ambulatory Visit: Payer: Self-pay

## 2022-03-28 DIAGNOSIS — F32A Depression, unspecified: Secondary | ICD-10-CM | POA: Insufficient documentation

## 2022-03-28 DIAGNOSIS — F419 Anxiety disorder, unspecified: Secondary | ICD-10-CM | POA: Insufficient documentation

## 2022-03-28 DIAGNOSIS — Z1152 Encounter for screening for COVID-19: Secondary | ICD-10-CM | POA: Diagnosis not present

## 2022-03-28 DIAGNOSIS — N390 Urinary tract infection, site not specified: Secondary | ICD-10-CM | POA: Insufficient documentation

## 2022-03-28 DIAGNOSIS — E876 Hypokalemia: Secondary | ICD-10-CM | POA: Insufficient documentation

## 2022-03-28 DIAGNOSIS — G9341 Metabolic encephalopathy: Secondary | ICD-10-CM

## 2022-03-28 DIAGNOSIS — E871 Hypo-osmolality and hyponatremia: Secondary | ICD-10-CM | POA: Diagnosis not present

## 2022-03-28 DIAGNOSIS — J101 Influenza due to other identified influenza virus with other respiratory manifestations: Principal | ICD-10-CM | POA: Insufficient documentation

## 2022-03-28 DIAGNOSIS — M436 Torticollis: Secondary | ICD-10-CM | POA: Diagnosis present

## 2022-03-28 DIAGNOSIS — I5022 Chronic systolic (congestive) heart failure: Secondary | ICD-10-CM | POA: Diagnosis not present

## 2022-03-28 DIAGNOSIS — F0390 Unspecified dementia without behavioral disturbance: Secondary | ICD-10-CM | POA: Insufficient documentation

## 2022-03-28 DIAGNOSIS — I4891 Unspecified atrial fibrillation: Secondary | ICD-10-CM | POA: Insufficient documentation

## 2022-03-28 LAB — CBC WITH DIFFERENTIAL/PLATELET
Abs Immature Granulocytes: 0.11 10*3/uL — ABNORMAL HIGH (ref 0.00–0.07)
Basophils Absolute: 0 10*3/uL (ref 0.0–0.1)
Basophils Relative: 0 %
Eosinophils Absolute: 0 10*3/uL (ref 0.0–0.5)
Eosinophils Relative: 0 %
HCT: 39.4 % (ref 36.0–46.0)
Hemoglobin: 12.6 g/dL (ref 12.0–15.0)
Immature Granulocytes: 1 %
Lymphocytes Relative: 5 %
Lymphs Abs: 0.8 10*3/uL (ref 0.7–4.0)
MCH: 30.4 pg (ref 26.0–34.0)
MCHC: 32 g/dL (ref 30.0–36.0)
MCV: 94.9 fL (ref 80.0–100.0)
Monocytes Absolute: 1.4 10*3/uL — ABNORMAL HIGH (ref 0.1–1.0)
Monocytes Relative: 9 %
Neutro Abs: 13.5 10*3/uL — ABNORMAL HIGH (ref 1.7–7.7)
Neutrophils Relative %: 85 %
Platelets: 467 10*3/uL — ABNORMAL HIGH (ref 150–400)
RBC: 4.15 MIL/uL (ref 3.87–5.11)
RDW: 13.5 % (ref 11.5–15.5)
WBC: 15.8 10*3/uL — ABNORMAL HIGH (ref 4.0–10.5)
nRBC: 0 % (ref 0.0–0.2)

## 2022-03-28 LAB — URINALYSIS, ROUTINE W REFLEX MICROSCOPIC
Bilirubin Urine: NEGATIVE
Glucose, UA: NEGATIVE mg/dL
Ketones, ur: 80 mg/dL — AB
Leukocytes,Ua: NEGATIVE
Nitrite: NEGATIVE
Protein, ur: 100 mg/dL — AB
Specific Gravity, Urine: 1.024 (ref 1.005–1.030)
pH: 5 (ref 5.0–8.0)

## 2022-03-28 LAB — LACTIC ACID, PLASMA
Lactic Acid, Venous: 1.3 mmol/L (ref 0.5–1.9)
Lactic Acid, Venous: 1.2 mmol/L (ref 0.5–1.9)

## 2022-03-28 LAB — COMPREHENSIVE METABOLIC PANEL
ALT: 19 U/L (ref 0–44)
AST: 23 U/L (ref 15–41)
Albumin: 3.1 g/dL — ABNORMAL LOW (ref 3.5–5.0)
Alkaline Phosphatase: 64 U/L (ref 38–126)
Anion gap: 17 — ABNORMAL HIGH (ref 5–15)
BUN: 13 mg/dL (ref 8–23)
CO2: 24 mmol/L (ref 22–32)
Calcium: 9.3 mg/dL (ref 8.9–10.3)
Chloride: 93 mmol/L — ABNORMAL LOW (ref 98–111)
Creatinine, Ser: 0.53 mg/dL (ref 0.44–1.00)
GFR, Estimated: >60 mL/min (ref >60)
Glucose, Bld: 130 mg/dL — ABNORMAL HIGH (ref 70–99)
Potassium: 3.2 mmol/L — ABNORMAL LOW (ref 3.5–5.1)
Sodium: 134 mmol/L — ABNORMAL LOW (ref 135–145)
Total Bilirubin: 1.2 mg/dL (ref 0.3–1.2)
Total Protein: 8.7 g/dL — ABNORMAL HIGH (ref 6.5–8.1)

## 2022-03-28 LAB — TROPONIN I (HIGH SENSITIVITY)
Troponin I (High Sensitivity): 9 ng/L (ref <18)
Troponin I (High Sensitivity): 9 ng/L (ref <18)

## 2022-03-28 LAB — RESP PANEL BY RT-PCR (RSV, FLU A&B, COVID)  RVPGX2
Influenza A by PCR: NEGATIVE
Influenza B by PCR: NEGATIVE
Resp Syncytial Virus by PCR: NEGATIVE
SARS Coronavirus 2 by RT PCR: NEGATIVE

## 2022-03-28 LAB — CK: Total CK: 61 U/L (ref 38–234)

## 2022-03-28 MED ORDER — ONDANSETRON HCL 4 MG/2ML IJ SOLN: 4.0000 mg | Freq: Four times a day (QID) | INTRAMUSCULAR | Status: DC | PRN

## 2022-03-28 MED ORDER — ONDANSETRON HCL 4 MG PO TABS: 4.0000 mg | ORAL_TABLET | Freq: Four times a day (QID) | ORAL | Status: DC | PRN

## 2022-03-28 MED ORDER — SODIUM CHLORIDE 0.9 % IV BOLUS
500.0000 mL | Freq: Once | INTRAVENOUS | Status: AC
  Administered 2022-03-28: 500 mL via INTRAVENOUS

## 2022-03-28 MED ORDER — MAGNESIUM HYDROXIDE 400 MG/5ML PO SUSP: 30.0000 mL | Freq: Every day | ORAL | Status: DC | PRN

## 2022-03-28 MED ORDER — CEPHALEXIN 500 MG PO CAPS
500.0000 mg | ORAL_CAPSULE | Freq: Once | ORAL | Status: AC
  Administered 2022-03-28: 500 mg via ORAL
  Filled 2022-03-28: qty 1

## 2022-03-28 MED ORDER — FERROUS SULFATE 325 (65 FE) MG PO TABS
325.0000 mg | ORAL_TABLET | Freq: Every day | ORAL | Status: DC
  Administered 2022-03-29: 325 mg via ORAL
  Filled 2022-03-28: qty 1

## 2022-03-28 MED ORDER — SENNOSIDES-DOCUSATE SODIUM 8.6-50 MG PO TABS: 2.0000 | ORAL_TABLET | Freq: Every evening | ORAL | Status: DC | PRN

## 2022-03-28 MED ORDER — POTASSIUM CHLORIDE IN NACL 20-0.9 MEQ/L-% IV SOLN
INTRAVENOUS | Status: DC
  Filled 2022-03-28: qty 1000

## 2022-03-28 MED ORDER — ACETAMINOPHEN 500 MG PO TABS
1000.0000 mg | ORAL_TABLET | Freq: Once | ORAL | Status: AC
  Administered 2022-03-28: 1000 mg via ORAL
  Filled 2022-03-28: qty 2

## 2022-03-28 MED ORDER — CEPHALEXIN 500 MG PO CAPS: 500.0000 mg | ORAL_CAPSULE | Freq: Four times a day (QID) | ORAL | 0 refills | Status: DC

## 2022-03-28 MED ORDER — SODIUM CHLORIDE 0.9 % IV SOLN
1.0000 g | INTRAVENOUS | Status: DC
  Administered 2022-03-28: 1 g via INTRAVENOUS
  Filled 2022-03-28: qty 10

## 2022-03-28 MED ORDER — ADULT MULTIVITAMIN W/MINERALS CH
1.0000 | ORAL_TABLET | Freq: Every day | ORAL | Status: DC
  Administered 2022-03-29: 1 via ORAL
  Filled 2022-03-28: qty 1

## 2022-03-28 MED ORDER — POTASSIUM CHLORIDE CRYS ER 20 MEQ PO TBCR
20.0000 meq | EXTENDED_RELEASE_TABLET | Freq: Every day | ORAL | Status: DC
  Administered 2022-03-28 – 2022-03-29 (×2): 20 meq via ORAL
  Filled 2022-03-28 (×2): qty 1

## 2022-03-28 MED ORDER — ACETAMINOPHEN 325 MG PO TABS: 650.0000 mg | ORAL_TABLET | Freq: Four times a day (QID) | ORAL | Status: DC | PRN

## 2022-03-28 MED ORDER — POTASSIUM CHLORIDE 10 MEQ/100ML IV SOLN
10.0000 meq | Freq: Once | INTRAVENOUS | Status: AC
  Administered 2022-03-28: 10 meq via INTRAVENOUS
  Filled 2022-03-28: qty 100

## 2022-03-28 MED ORDER — OSELTAMIVIR PHOSPHATE 30 MG PO CAPS
30.0000 mg | ORAL_CAPSULE | Freq: Two times a day (BID) | ORAL | Status: DC
  Administered 2022-03-29: 30 mg via ORAL
  Filled 2022-03-28: qty 1

## 2022-03-28 MED ORDER — METOPROLOL SUCCINATE ER 50 MG PO TB24
25.0000 mg | ORAL_TABLET | Freq: Every day | ORAL | Status: DC
  Administered 2022-03-29: 25 mg via ORAL
  Filled 2022-03-28: qty 1

## 2022-03-28 MED ORDER — POLYETHYLENE GLYCOL 3350 17 G PO PACK: 17.0000 g | PACK | Freq: Every day | ORAL | Status: DC | PRN

## 2022-03-28 MED ORDER — OSELTAMIVIR PHOSPHATE 75 MG PO CAPS
75.0000 mg | ORAL_CAPSULE | Freq: Two times a day (BID) | ORAL | Status: DC
  Administered 2022-03-28: 75 mg via ORAL
  Filled 2022-03-28: qty 1

## 2022-03-28 MED ORDER — ENOXAPARIN SODIUM 30 MG/0.3ML IJ SOSY
30.0000 mg | PREFILLED_SYRINGE | INTRAMUSCULAR | Status: DC
  Administered 2022-03-29: 30 mg via SUBCUTANEOUS
  Filled 2022-03-28: qty 0.3

## 2022-03-28 MED ORDER — TRAZODONE HCL 50 MG PO TABS: 25.0000 mg | ORAL_TABLET | Freq: Every evening | ORAL | Status: DC | PRN

## 2022-03-28 MED ORDER — ALPRAZOLAM 0.25 MG PO TABS: 0.2500 mg | ORAL_TABLET | Freq: Three times a day (TID) | ORAL | Status: DC | PRN

## 2022-03-28 MED ORDER — ACETAMINOPHEN 650 MG RE SUPP: 650.0000 mg | Freq: Four times a day (QID) | RECTAL | Status: DC | PRN

## 2022-03-28 NOTE — ED Notes (Signed)
Patient transported to CT 

## 2022-03-28 NOTE — Assessment & Plan Note (Signed)
-   We will Replace potassium and follow level, as well as check magnesium level.

## 2022-03-28 NOTE — Assessment & Plan Note (Signed)
-   This associated with generalized weakness. - It could be related to her influenza A and possible UTI.

## 2022-03-28 NOTE — Progress Notes (Signed)
PHARMACIST - PHYSICIAN COMMUNICATION  CONCERNING:  Enoxaparin (Lovenox) for DVT Prophylaxis    RECOMMENDATION: Patient was prescribed enoxaprin '40mg'$  q24 hours for VTE prophylaxis.   Filed Weights   03/28/22 2346  Weight: 43.9 kg (96 lb 12.5 oz)    Body mass index is 17.14 kg/m.  Estimated Creatinine Clearance: 40.2 mL/min (by C-G formula based on SCr of 0.53 mg/dL).  Patient is candidate for enoxaparin '30mg'$  every 24 hours based on CrCl <77m/min or Weight <45kg  DESCRIPTION: [facility]  Patient is now receiving enoxaparin 30 mg every 24 hours   NRenda Rolls PharmD, M[facility]12/17/2023 11:49 PM

## 2022-03-28 NOTE — ED Notes (Signed)
Report given to Alex, RN

## 2022-03-28 NOTE — Assessment & Plan Note (Signed)
-   This is likely hypovolemic from dehydration as she looks clinically very dry. - We will hydrate with IV normal saline with added potassium chloride and will follow her BMP.

## 2022-03-28 NOTE — ED Triage Notes (Signed)
Pt in via EMS from [facility] assisted living with c/o neck pain for 1 week. Pt has been sleeping in her recliner slumped down and her neck is cricked to the right. EMS reports pt also tested (+) for the flu Thursday. 97.0 temp, 95% RA, HR 84, 162/82

## 2022-03-28 NOTE — H&P (Signed)
[place]   PATIENT NAME: Kimberly Cantu    MR#:  [id number]  DATE OF BIRTH:  Nov 04, 1945  DATE OF ADMISSION:  03/28/2022  PRIMARY CARE PHYSICIAN: Gladstone Lighter, MD   Patient is coming from: SNF  REQUESTING/REFERRING PHYSICIAN: Rada Hay, MD  CHIEF COMPLAINT:   Chief Complaint  Patient presents with   Torticollis    HISTORY OF PRESENT ILLNESS:  Kimberly Cantu is a 76 y.o. female with medical history significant for systolic CHF, atrial fibrillation, dilated cardiomyopathy, and anemia, who presented to the emergency room with acute onset of generalized weakness with failure to thrive.  Per her husband she has been having neck pain and mild headache at her SNF.  No reported fever or chills.  No reported nausea or vomiting or abdominal pain.  No reported chest pain or cough or wheezing.  Her husband saw her the last time at her usual state of health on Friday.  He himself had recent influenza.  The patient was somnolent in the ER and difficult to arouse and therefore none historian.  ED Course: When she came to the ER, BP was 138/119 with otherwise normal vital signs.  Labs revealed hyponatremia of 134 and hypokalemia of 93 with a potassium of 3.2 and blood glucose of 130.  Anion gap was 17 and BUN 3.1 with total protein of 8.7.  High sensitive troponin I was 9 twice and CK was 61.  Lactic acid was 1.3 and later 1.2 CBC showed leukocytosis 15.8 and neutrophilia with thrombocytosis of 467. Urinalysis showed 21-50 RBCs and 11-20 WBCs with rare bacteria, 100 protein and 80 ketones with cloudy urine.  Imaging: 2 view chest x-ray showed no acute cardiopulmonary disease.  It showed age indeterminate compression fracture of the mid thoracic vertebral body that is new compared to prior imaging on 04/18/2019.  It also showed new mild elevation of the right hemidiaphragm.  The patient was given a gram of p.o. Tylenol, 500 mg of p.o. Keflex 10 [place] of IV potassium  chloride and 500 mill IV normal saline.  The patient will need to an observation medical bed for further evaluation and management PAST MEDICAL HISTORY:   Past Medical History:  Diagnosis Date   Anemia    CHF (congestive heart failure) ([place])   -Atrial fibrillation  PAST SURGICAL HISTORY:   Past Surgical History:  Procedure Laterality Date   HIP PINNING Left     SOCIAL HISTORY:   Social History   Tobacco Use   Smoking status: Former   Smokeless tobacco: Never  Substance Use Topics   Alcohol use: Never    FAMILY HISTORY:  No family history on file.  No pertinent female diseases  DRUG ALLERGIES:  No Known Allergies  REVIEW OF SYSTEMS:   ROS As per history of present illness. All pertinent systems were reviewed above. Constitutional, HEENT, cardiovascular, respiratory, GI, GU, musculoskeletal, neuro, psychiatric, endocrine, integumentary and hematologic systems were reviewed and are otherwise negative/unremarkable except for positive findings mentioned above in the HPI.   MEDICATIONS AT HOME:   Prior to Admission medications   Medication Sig Start Date End Date Taking? Authorizing Provider  cephALEXin (KEFLEX) 500 MG capsule Take 1 capsule (500 mg total) by mouth 4 (four) times daily for 7 days. 03/28/22 04/04/22 Yes Rada Hay, MD  ALPRAZolam Duanne Moron) 0.5 MG tablet Take 0.5 tablets (0.25 mg total) by mouth 3 (three) times daily as needed for up to 30 doses for anxiety or sleep. 04/21/19  Amin, Ankit Chirag, MD  ferrous sulfate 325 (65 FE) MG tablet Take 1 tablet (325 mg total) by mouth daily. 04/21/19 04/20/20  Amin, Jeanella Flattery, MD  furosemide (LASIX) 40 MG tablet Take 1 tablet (40 mg total) by mouth daily. 04/21/19   Amin, Jeanella Flattery, MD  metoprolol succinate (TOPROL-XL) 25 MG 24 hr tablet Take 1 tablet (25 mg total) by mouth daily. 04/21/19   Amin, Jeanella Flattery, MD  Multiple Vitamin (MULTIVITAMIN WITH MINERALS) TABS tablet Take 1 tablet by mouth daily. 04/21/19   Amin,  Jeanella Flattery, MD  polyethylene glycol (MIRALAX / GLYCOLAX) 17 g packet Take 17 g by mouth daily as needed for moderate constipation or severe constipation. 04/21/19   Amin, Jeanella Flattery, MD  potassium chloride SA (KLOR-CON) 20 MEQ tablet Take 1 tablet (20 mEq total) by mouth daily. 04/21/19   Amin, Jeanella Flattery, MD  senna-docusate (SENOKOT-S) 8.6-50 MG tablet Take 2 tablets by mouth at bedtime as needed for mild constipation or moderate constipation. 04/21/19   Amin, Jeanella Flattery, MD      VITAL SIGNS:  Blood pressure (!) 124/56, pulse 78, temperature 97.9 F (36.6 C), temperature source Oral, resp. rate 19, SpO2 92 %.  PHYSICAL EXAMINATION:  Physical Exam  GENERAL:  76 y.o.-year-old ill looking Caucasian female patient lying in the bed with no acute distress.  She was fairly somnolent and difficult to arouse EYES: Pupils equal, round, reactive to light and accommodation. No scleral icterus. Extraocular muscles intact.  HEENT: Head atraumatic, normocephalic. Oropharynx with very dry mucous membrane and tongue and nasopharynx clear.  NECK:  Supple, no jugular venous distention. No thyroid enlargement, no tenderness.  LUNGS: Normal breath sounds bilaterally, no wheezing, rales,rhonchi or crepitation. No use of accessory muscles of respiration.  CARDIOVASCULAR: Regular rate and rhythm, S1, S2 normal. No murmurs, rubs, or gallops.  ABDOMEN: Soft, nondistended, nontender. Bowel sounds present. No organomegaly or mass.  EXTREMITIES: No pedal edema, cyanosis, or clubbing.  NEUROLOGIC: No lateralizing signs.   PSYCHIATRIC: The patient is very somnolent and difficult to rouse.   SKIN: No obvious rash, lesion, or ulcer.   LABORATORY PANEL:   CBC Recent Labs  Lab 03/28/22 1314  WBC 15.8*  HGB 12.6  HCT 39.4  PLT 467*   ------------------------------------------------------------------------------------------------------------------  Chemistries  Recent Labs  Lab 03/28/22 1314  NA 134*  K  3.2*  CL 93*  CO2 24  GLUCOSE 130*  BUN 13  CREATININE 0.53  CALCIUM 9.3  AST 23  ALT 19  ALKPHOS 64  BILITOT 1.2   ------------------------------------------------------------------------------------------------------------------  Cardiac Enzymes No results for input(s): "TROPONINI" in the last 168 hours. ------------------------------------------------------------------------------------------------------------------  RADIOLOGY:  CT HEAD WO CONTRAST (5MM)  Result Date: 03/28/2022 CLINICAL DATA:  Mental status change, unknown cause EXAM: CT HEAD WITHOUT CONTRAST TECHNIQUE: Contiguous axial images were obtained from the base of the skull through the vertex without intravenous contrast. RADIATION DOSE REDUCTION: This exam was performed according to the departmental dose-optimization program which includes automated exposure control, adjustment of the mA and/or kV according to patient size and/or use of iterative reconstruction technique. COMPARISON:  None Available. FINDINGS: Brain: Patchy and confluent areas of decreased attenuation are noted throughout the deep and periventricular white matter of the cerebral hemispheres bilaterally, compatible with chronic microvascular ischemic disease. No evidence of large-territorial acute infarction. No parenchymal hemorrhage. No mass lesion. No extra-axial collection. No mass effect or midline shift. No hydrocephalus. Basilar cisterns are patent. Vascular: No hyperdense vessel. Atherosclerotic calcifications are present within the  cavernous internal carotid arteries. Skull: No acute fracture or focal lesion. Sinuses/Orbits: Right sphenoid, maxillary, bilateral ethmoid sinus mucosal thickening. Otherwise paranasal sinuses and mastoid air cells are clear. The orbits are unremarkable. Other: None. IMPRESSION: No acute intracranial abnormality. Electronically Signed   By: Iven Finn M.D.   On: 03/28/2022 20:09   CT Cervical Spine Wo Contrast  Result  Date: 03/28/2022 CLINICAL DATA:  Neck pain x1 week EXAM: CT CERVICAL SPINE WITHOUT CONTRAST TECHNIQUE: Multidetector CT imaging of the cervical spine was performed without intravenous contrast. Multiplanar CT image reconstructions were also generated. RADIATION DOSE REDUCTION: This exam was performed according to the departmental dose-optimization program which includes automated exposure control, adjustment of the mA and/or kV according to patient size and/or use of iterative reconstruction technique. COMPARISON:  None Available. FINDINGS: Alignment: Normal cervical lordosis. Skull base and vertebrae: No acute fracture. No primary bone lesion or focal pathologic process. Soft tissues and spinal canal: No prevertebral fluid or swelling. No visible canal hematoma. Disc levels: Mild degenerative changes at C5-6. Spinal canal is patent. Upper chest: Visualized lung apices are clear. Other: Visualized thyroid is unremarkable. IMPRESSION: Mild degenerative changes at C5-6.  Otherwise negative. Electronically Signed   By: Julian Hy M.D.   On: 03/28/2022 20:01   DG Chest 2 View  Result Date: 03/28/2022 CLINICAL DATA:  Altered mental status. Neck pain for 1 week. Patient tested positive for nfluenza EXAM: CHEST - 2 VIEW COMPARISON:  Chest radiograph 04/19/2019,CT angio chest 04/18/2019 FINDINGS: New mild elevation of the right hemidiaphragm. The heart size and mediastinal contours are within normal limits. No focal consolidation, pleural effusion, or pneumothorax. Diffuse osteopenia. Compression fracture of a midthoracic vertebral body with approximately 50% vertebral body height loss; this finding is new compared to prior CT chest 04/18/2019. Anterior wedging of several distal thoracic vertebral bodies. Old fractures of the posterolateral left seventh, eighth, and ninth ribs. IMPRESSION: 1. No acute cardiopulmonary abnormality. 2. Age-indeterminate compression fracture of a midthoracic vertebral body, new  compared to most recent prior imaging on 04/18/2019. 3. New mild elevation of the right hemidiaphragm. Electronically Signed   By: Ileana Roup M.D.   On: 03/28/2022 14:20      IMPRESSION AND PLAN:  Assessment and Plan: * Influenza A - The patient will be admitted to a medical bed. - We will place on p.o. Tamiflu. - This could be significantly contributing to her generalized weakness and myalgia with neck pain. - We will place her on hydration with IV normal saline with added potassium chloride.  Acute metabolic encephalopathy - This associated with generalized weakness. - It could be related to her influenza A and possible UTI.  Acute lower UTI - This is a possibility and will be confirmed with urine culture and sensitivity. .-Given her altered mental status, will go ahead and place on IV Rocephin for now.  Hyponatremia - This is likely hypovolemic from dehydration as she looks clinically very dry. - We will hydrate with IV normal saline with added potassium chloride and will follow her BMP.  Hypokalemia - We will Replace potassium and follow level, as well as check magnesium level.  Anxiety and depression - We will continue Zoloft and Xanax with improved mental status.  Dementia without behavioral disturbance ([place]) - We will continue Aricept with improved mental status.    DVT prophylaxis: Lovenox. Advanced Care Planning:  Code Status: She is DNR/DNI.  I discussed that with her husband over the phone. Family Communication:  The plan of care  was discussed in details with the patient (and her husband). I answered all questions. The patient agreed to proceed with the above mentioned plan. Further management will depend upon hospital course. Disposition Plan: Back to previous home environment Consults called: none.- All the records are reviewed and case discussed with ED provider.  Status is: Observation  I certify that at the time of admission, it is my clinical judgment that  the patient will require inpatient hospital care extending less than 2 midnights.                            Dispo: The patient is from: SNF              Anticipated d/c is to: SNF              Patient currently is not medically stable to d/c.              Difficult to place patient: No  Christel Mormon M.D on 03/28/2022 at 10:01 PM  [facility]   From 7 PM-7 AM, contact night-coverage [email or web address]  CC: Primary care physician; Gladstone Lighter, MD

## 2022-03-28 NOTE — ED Provider Notes (Signed)
[facility] Provider Note    Event Date/Time   First MD Initiated Contact with Patient 03/28/22 1203     (approximate)   History   Torticollis   HPI  Kimberly Cantu is a 76 y.o. female   presents to the ED via EMS from home place with reported neck pain per nursing staff.  EMS reports that she was sleeping in her recliner.  Nursing staff reports that she tested positive for the flu 4 days ago.  Patient unable to answer questions of orientation.  Patient has a history of severe anemia, CHF, respiratory failure with hypoxia, elevated troponin not due to acute coronary syndrome, A-fib with RVR.  Nursing staff at [facility] contacted daughter who states that patient has dementia and that this is her baseline.      Physical Exam   Triage Vital Signs: ED Triage Vitals  Enc Vitals Group     BP 03/28/22 1058 (!) 138/119     Pulse Rate 03/28/22 1058 88     Resp 03/28/22 1058 20     Temp 03/28/22 1058 97.9 F (36.6 C)     Temp Source 03/28/22 1058 Oral     SpO2 03/28/22 1058 95 %     Weight --      Height --      Head Circumference --      Peak Flow --      Pain Score 03/28/22 1059 0     Pain Loc --      Pain Edu? --      Excl. in [place]? --     Most recent vital signs: Vitals:   03/28/22 1058 03/28/22 1459  BP: (!) 138/119 (!) 160/75  Pulse: 88   Resp: 20   Temp: 97.9 F (36.6 C)   SpO2: 95%      General: Awake, no distress.  Talkative, voicing that she is afraid, asking for help frequently, worried that she is going to fall off the bed. CV:  Good peripheral perfusion.  Resp:  Normal effort.  No wheezes, rales or rhonchi noted.  No difficulty with respirations. Abd:  No distention.  Flat, soft, nontender. Other:  No edema noted lower extremities.   ED Results / Procedures / Treatments   Labs (all labs ordered are listed, but only abnormal results are displayed) Labs Reviewed  CBC WITH DIFFERENTIAL/PLATELET - Abnormal; Notable for the  following components:      Result Value   WBC 15.8 (*)    Platelets 467 (*)    Neutro Abs 13.5 (*)    Monocytes Absolute 1.4 (*)    Abs Immature Granulocytes 0.11 (*)    All other components within normal limits  COMPREHENSIVE METABOLIC PANEL - Abnormal; Notable for the following components:   Sodium 134 (*)    Potassium 3.2 (*)    Chloride 93 (*)    Glucose, Bld 130 (*)    Total Protein 8.7 (*)    Albumin 3.1 (*)    Anion gap 17 (*)    All other components within normal limits  LACTIC ACID, PLASMA  URINALYSIS, ROUTINE W REFLEX MICROSCOPIC  LACTIC ACID, PLASMA  TROPONIN I (HIGH SENSITIVITY)  TROPONIN I (HIGH SENSITIVITY)     EKG Pending  RADIOLOGY  Chest x-ray images reviewed by myself independent of the radiologist and no actual infiltrate was noted.  Radiology report mentions mild elevation of right hemidiaphragm.  Heart size and mediastinal contours are within normal limits and no focal  consolidation, effusion or pneumothorax is noted.  Possibly a new compression fracture mid thoracic vertebral body when compared with images from 04/18/2019.  Old healing fractures posterior lateral left seventh eighth and ninth ribs.     PROCEDURES:  Critical Care performed:   Procedures   MEDICATIONS ORDERED IN ED: Medications  potassium chloride 10 mEq in 100 mL IVPB (10 mEq Intravenous New Bag/Given 03/28/22 1452)  sodium chloride 0.9 % bolus 500 mL (500 mLs Intravenous New Bag/Given 03/28/22 1456)     IMPRESSION / MDM / ASSESSMENT AND PLAN / ED COURSE  I reviewed the triage vital signs and the nursing notes.   Differential diagnosis includes, but is not limited to, altered mental status, influenza, pneumonia, urinary tract infection, sepsis, dehydration, history of dementia,  76 year old female is brought to the ED via EMS with reported confusion.  Patient was seen at the office at [facility] on 03/25/2022 at which time she tested positive for influenza A and diagnosed  with dehydration.  Orders show that patient was given IV fluids.  Nursing staff at [facility] spoke with the daughter who states that patient does have a history of dementia and she is at her baseline.  WBC 15.8, lactic acid 1.3, patient is hypokalemic with a potassium of 3.2 and she was given 10 mEq of potassium IV after which patient began taking some p.o. fluids without difficulty.  Urinalysis was ordered and at this time has not been collected.  Rest x-ray was negative for focal infiltrate, effusion or pneumothorax.  A compression fracture was noted thoracic spine not seen in prior images from 2021.  ----------------------------------------- 3:50 PM on 03/28/2022 ----------------------------------------- Discussed with daughter Georjean Mode lab findings thus far.  Ms. Dara Lords states that patient does have dementia and on any given day does not know the month, president etc. this is nothing new.  Patient has decreased appetite for the last 2 weeks and was seen over [facility] for influenza A and dehydration.  Physical therapy does work with patient 2-3 times a week for ambulation.  Patient does not sleep in a bed but stays in a recliner.  Patient has a history of anxiety per daughter.  ----------------------------------------- 3:56 PM on 03/28/2022 ----------------------------------------- At this time care of this patient is being turned over to Acquanetta Belling, MD who is also working in the pod today.  Currently we are waiting on a urinalysis and reevaluation of this patient for disposition.    Patient's presentation is most consistent with acute illness / injury with system symptoms.  FINAL CLINICAL IMPRESSION(S) / ED DIAGNOSES   Final diagnoses:  Influenza A     Rx / DC Orders   ED Discharge Orders     None        Note:  This document was prepared using Dragon voice recognition software and may include unintentional dictation errors.   Johnn Hai, PA-C 03/28/22 1559     Rada Hay, MD 03/28/22 1747    Rada Hay, MD 03/28/22 Curly Rim

## 2022-03-28 NOTE — Assessment & Plan Note (Signed)
Influenza, COVID and RSV PCR was negative. Due to concern of exposure she is being started on low-dose Tamiflu as prophylaxis.

## 2022-03-28 NOTE — ED Notes (Signed)
Patient states her neck does not hurt any longer. Purewick is in place.

## 2022-03-28 NOTE — Assessment & Plan Note (Signed)
-   We will continue Zoloft and Xanax with improved mental status.

## 2022-03-28 NOTE — ED Notes (Signed)
Patient cleaned due to bowel incontinence.

## 2022-03-28 NOTE — Assessment & Plan Note (Signed)
-   This is a possibility and will be confirmed with urine culture and sensitivity. .-Given her altered mental status, will go ahead and place on IV Rocephin for now.

## 2022-03-28 NOTE — ED Triage Notes (Signed)
Pt states she wants to get out of here. Pt unable to tell me her name or what month we are in. Pt states she is not in any pain. Pt is asking where her mother and daughter are.

## 2022-03-28 NOTE — ED Notes (Signed)
Patient c/o neck pain when two staff members repositioned her on the stretcher. Dr. Starleen Blue aware. Patient then states that her neck doesn't hurt, but c/o pain when asked to move head to another position.

## 2022-03-28 NOTE — Assessment & Plan Note (Signed)
-   We will continue Aricept with improved mental status.

## 2022-03-29 DIAGNOSIS — J101 Influenza due to other identified influenza virus with other respiratory manifestations: Secondary | ICD-10-CM | POA: Diagnosis not present

## 2022-03-29 LAB — CBC
HCT: 30.4 % — ABNORMAL LOW (ref 36.0–46.0)
Hemoglobin: 10.1 g/dL — ABNORMAL LOW (ref 12.0–15.0)
MCH: 30.4 pg (ref 26.0–34.0)
MCHC: 33.2 g/dL (ref 30.0–36.0)
MCV: 91.6 fL (ref 80.0–100.0)
Platelets: 448 10*3/uL — ABNORMAL HIGH (ref 150–400)
RBC: 3.32 MIL/uL — ABNORMAL LOW (ref 3.87–5.11)
RDW: 13.7 % (ref 11.5–15.5)
WBC: 12.5 10*3/uL — ABNORMAL HIGH (ref 4.0–10.5)
nRBC: 0 % (ref 0.0–0.2)

## 2022-03-29 LAB — BASIC METABOLIC PANEL
Anion gap: 8 (ref 5–15)
BUN: 13 mg/dL (ref 8–23)
CO2: 26 mmol/L (ref 22–32)
Calcium: 8.6 mg/dL — ABNORMAL LOW (ref 8.9–10.3)
Chloride: 102 mmol/L (ref 98–111)
Creatinine, Ser: 0.48 mg/dL (ref 0.44–1.00)
GFR, Estimated: >60 mL/min (ref >60)
Glucose, Bld: 97 mg/dL (ref 70–99)
Potassium: 3.3 mmol/L — ABNORMAL LOW (ref 3.5–5.1)
Sodium: 136 mmol/L (ref 135–145)

## 2022-03-29 LAB — MAGNESIUM: Magnesium: 1.9 mg/dL (ref 1.7–2.4)

## 2022-03-29 MED ORDER — CEPHALEXIN 500 MG PO CAPS: 500.0000 mg | ORAL_CAPSULE | Freq: Three times a day (TID) | ORAL | 0 refills | Status: AC

## 2022-03-29 MED ORDER — OSELTAMIVIR PHOSPHATE 30 MG PO CAPS: 30.0000 mg | ORAL_CAPSULE | Freq: Two times a day (BID) | ORAL | 0 refills | Status: DC

## 2022-03-29 MED ORDER — CEPHALEXIN 500 MG PO CAPS: 500.0000 mg | ORAL_CAPSULE | Freq: Three times a day (TID) | ORAL | 0 refills | Status: DC

## 2022-03-29 MED ORDER — POTASSIUM CHLORIDE 20 MEQ PO PACK
40.0000 meq | PACK | Freq: Once | ORAL | Status: AC
  Administered 2022-03-29: 40 meq via ORAL
  Filled 2022-03-29: qty 2

## 2022-03-29 MED ORDER — OSELTAMIVIR PHOSPHATE 30 MG PO CAPS: 30.0000 mg | ORAL_CAPSULE | Freq: Two times a day (BID) | ORAL | 0 refills | Status: AC

## 2022-03-29 NOTE — Progress Notes (Addendum)
Patient to return to [facility] today per MD. CSW spoke with [facility] they report they can pick patient up by 12:30 pm.   No further dc needs.   Kelby Fam, Ronald, MSW, [place]

## 2022-03-29 NOTE — Hospital Course (Addendum)
Taken from H&P.  Kimberly Cantu is a 76 y.o. female with medical history significant for systolic CHF, atrial fibrillation, dilated cardiomyopathy, and anemia, who presented to the emergency room with acute onset of generalized weakness with failure to thrive.  Per her husband she has been having neck pain and mild headache at her SNF.  No reported fever or chills.  No reported nausea or vomiting or abdominal pain.  No reported chest pain or cough or wheezing.  Her husband saw her the last time at her usual state of health on Friday.  He himself had recent influenza.  The patient was somnolent in the ER and difficult to arouse.  ED course.  On arrival stable vitals.Labs revealed hyponatremia of 134 and hypokalemia of 93 with a potassium of 3.2 and blood glucose of 130.  Anion gap was 17.  High sensitive troponin I was 9 twice and CK was 61.  Lactic acid was 1.3 and later 1.2 CBC showed leukocytosis 15.8 and neutrophilia with thrombocytosis of 467.  It was negative while SHE was started Urinalysis showed 21-50 RBCs and 11-20 WBCs with rare bacteria, 100 protein and 80 ketones with cloudy urine.  COVID-19 flu and RSV negative.   Imaging: 2 view chest x-ray showed no acute cardiopulmonary disease.  It showed age indeterminate compression fracture of the mid thoracic vertebral body that is new compared to prior imaging on 04/18/2019.  It also showed new mild elevation of the right hemidiaphragm.  12/18: Elevated blood pressure at 172/73.  Leukocytosis with slight improvement to 12.5, hemoglobin 10.1, all cell lines decreased so some dilutional effect on with IV fluid.  Potassium improved to 3.3.  Magnesium normal.  Urine cultures pending.  Potassium was repleted. Patient otherwise appears to be at her baseline.  She might have an exposure to influenza if her husband was positive as mentioned in the chart.  She can complete a course of prophylactic Tamiflu at a lower dose.  She was also given Keflex for concern  of UTI with pending culture results which can be followed up at her facility.  Patient had advanced dementia and unable to participate with any meaningful history.  She will continue with the rest of her home medications and need to follow-up closely with her primary care provider for further recommendations.

## 2022-03-29 NOTE — Discharge Summary (Signed)
Physician Discharge Summary   Patient: Kimberly Cantu MRN: [id number] DOB: 05-10-1945  Admit date:     03/28/2022  Discharge date: 03/29/22  Discharge Physician: Lorella Nimrod   PCP: Gladstone Lighter, MD   Recommendations at discharge:  Please obtain CBC and BMP in 1 week Follow-up final urine culture results Patient is being just with prophylactic Tamiflu and Keflex for concern of UTI. Follow-up with primary care provider within a week  Discharge Diagnoses: Principal Problem:   Influenza A Active Problems:   Acute lower UTI   Acute metabolic encephalopathy   Hyponatremia   Dementia without behavioral disturbance (HCC)   Anxiety and depression   Hypokalemia   Hospital Course: Taken from H&P.  Kimberly Cantu is a 76 y.o. female with medical history significant for systolic CHF, atrial fibrillation, dilated cardiomyopathy, and anemia, who presented to the emergency room with acute onset of generalized weakness with failure to thrive.  Per her husband she has been having neck pain and mild headache at her SNF.  No reported fever or chills.  No reported nausea or vomiting or abdominal pain.  No reported chest pain or cough or wheezing.  Her husband saw her the last time at her usual state of health on Friday.  He himself had recent influenza.  The patient was somnolent in the ER and difficult to arouse.  ED course.  On arrival stable vitals.Labs revealed hyponatremia of 134 and hypokalemia of 93 with a potassium of 3.2 and blood glucose of 130.  Anion gap was 17.  High sensitive troponin I was 9 twice and CK was 61.  Lactic acid was 1.3 and later 1.2 CBC showed leukocytosis 15.8 and neutrophilia with thrombocytosis of 467.  It was negative while SHE was started Urinalysis showed 21-50 RBCs and 11-20 WBCs with rare bacteria, 100 protein and 80 ketones with cloudy urine.  COVID-19 flu and RSV negative.   Imaging: 2 view chest x-ray showed no acute cardiopulmonary disease.  It showed  age indeterminate compression fracture of the mid thoracic vertebral body that is new compared to prior imaging on 04/18/2019.  It also showed new mild elevation of the right hemidiaphragm.  12/18: Elevated blood pressure at 172/73.  Leukocytosis with slight improvement to 12.5, hemoglobin 10.1, all cell lines decreased so some dilutional effect on with IV fluid.  Potassium improved to 3.3.  Magnesium normal.  Urine cultures pending.  Potassium was repleted. Patient otherwise appears to be at her baseline.  She might have an exposure to influenza if her husband was positive as mentioned in the chart.  She can complete a course of prophylactic Tamiflu at a lower dose.  She was also given Keflex for concern of UTI with pending culture results which can be followed up at her facility.  Patient had advanced dementia and unable to participate with any meaningful history.  She will continue with the rest of her home medications and need to follow-up closely with her primary care provider for further recommendations.   Assessment and Plan: * Influenza A Influenza, COVID and RSV PCR was negative. Due to concern of exposure she is being started on low-dose Tamiflu as prophylaxis.  Acute metabolic encephalopathy - This associated with generalized weakness. - It could be related to her influenza A and possible UTI.  Acute lower UTI - This is a possibility and will be confirmed with urine culture and sensitivity. .-Given her altered mental status, will go ahead and place on IV Rocephin for now.  Hyponatremia -  This is likely hypovolemic from dehydration as she looks clinically very dry. - We will hydrate with IV normal saline with added potassium chloride and will follow her BMP.  Hypokalemia - We will Replace potassium and follow level, as well as check magnesium level.  Anxiety and depression - We will continue Zoloft and Xanax with improved mental status.  Dementia without behavioral  disturbance ([place]) - We will continue Aricept with improved mental status.         Consultants: None Procedures performed: None Disposition: Skilled nursing facility Diet recommendation:  Discharge Diet Orders (From admission, onward)     Start     Ordered   03/29/22 0000  Diet - low sodium heart healthy        03/29/22 1121           Cardiac diet DISCHARGE MEDICATION: Allergies as of 03/29/2022   No Known Allergies      Medication List     TAKE these medications    ALPRAZolam 0.5 MG tablet Commonly known as: Xanax Take 0.5 tablets (0.25 mg total) by mouth 3 (three) times daily as needed for up to 30 doses for anxiety or sleep.   cephALEXin 500 MG capsule Commonly known as: KEFLEX Take 1 capsule (500 mg total) by mouth 3 (three) times daily for 5 days.   cyanocobalamin 1000 MCG tablet Commonly known as: VITAMIN B12 Take 1,000 mcg by mouth daily.   donepezil 10 MG tablet Commonly known as: ARICEPT Take 10 mg by mouth at bedtime.   Erivedge 150 MG capsule Generic drug: vismodegib Take 150 mg by mouth daily.   ferrous sulfate 325 (65 FE) MG tablet Take 1 tablet (325 mg total) by mouth daily.   furosemide 40 MG tablet Commonly known as: LASIX Take 1 tablet (40 mg total) by mouth daily.   ibandronate 150 MG tablet Commonly known as: BONIVA Take 150 mg by mouth every 30 (thirty) days.   metoprolol succinate 25 MG 24 hr tablet Commonly known as: TOPROL-XL Take 1 tablet (25 mg total) by mouth daily.   multivitamin with minerals Tabs tablet Take 1 tablet by mouth daily.   oseltamivir 30 MG capsule Commonly known as: TAMIFLU Take 1 capsule (30 mg total) by mouth 2 (two) times daily for 4 days.   polyethylene glycol 17 g packet Commonly known as: MIRALAX / GLYCOLAX Take 17 g by mouth daily as needed for moderate constipation or severe constipation.   potassium chloride SA 20 MEQ tablet Commonly known as: KLOR-CON M Take 1 tablet (20 mEq total)  by mouth daily.   senna-docusate 8.6-50 MG tablet Commonly known as: Senokot-S Take 2 tablets by mouth at bedtime as needed for mild constipation or moderate constipation.   sertraline 50 MG tablet Commonly known as: ZOLOFT Take 50 mg by mouth 2 (two) times daily.   Vitamin D-1000 Max St 25 MCG (1000 UT) tablet Generic drug: Cholecalciferol Take 1,000 Units by mouth daily.        Follow-up Information     Gladstone Lighter, MD In 1 week.   Specialty: Internal Medicine Contact information: [place] [address] [phone]                Discharge Exam: Danley Danker Weights   03/28/22 2346  Weight: 43.9 kg   General.  Frail and malnourished elderly lady, in no acute distress. Pulmonary.  Lungs clear bilaterally, normal respiratory effort. CV.  Regular rate and rhythm, no JVD, rub or murmur.  Abdomen.  Soft, nontender, nondistended, BS positive. CNS.  Alert and oriented .  No focal neurologic deficit. Extremities.  No edema, no cyanosis, pulses intact and symmetrical. Psychiatry.  Judgment and insight appears normal.   Condition at discharge: stable  The results of significant diagnostics from this hospitalization (including imaging, microbiology, ancillary and laboratory) are listed below for reference.   Imaging Studies: CT HEAD WO CONTRAST (5MM)  Result Date: 03/28/2022 CLINICAL DATA:  Mental status change, unknown cause EXAM: CT HEAD WITHOUT CONTRAST TECHNIQUE: Contiguous axial images were obtained from the base of the skull through the vertex without intravenous contrast. RADIATION DOSE REDUCTION: This exam was performed according to the departmental dose-optimization program which includes automated exposure control, adjustment of the mA and/or kV according to patient size and/or use of iterative reconstruction technique. COMPARISON:  None Available. FINDINGS: Brain: Patchy and confluent areas of decreased attenuation are noted throughout the  deep and periventricular white matter of the cerebral hemispheres bilaterally, compatible with chronic microvascular ischemic disease. No evidence of large-territorial acute infarction. No parenchymal hemorrhage. No mass lesion. No extra-axial collection. No mass effect or midline shift. No hydrocephalus. Basilar cisterns are patent. Vascular: No hyperdense vessel. Atherosclerotic calcifications are present within the cavernous internal carotid arteries. Skull: No acute fracture or focal lesion. Sinuses/Orbits: Right sphenoid, maxillary, bilateral ethmoid sinus mucosal thickening. Otherwise paranasal sinuses and mastoid air cells are clear. The orbits are unremarkable. Other: None. IMPRESSION: No acute intracranial abnormality. Electronically Signed   By: Iven Finn M.D.   On: 03/28/2022 20:09   CT Cervical Spine Wo Contrast  Result Date: 03/28/2022 CLINICAL DATA:  Neck pain x1 week EXAM: CT CERVICAL SPINE WITHOUT CONTRAST TECHNIQUE: Multidetector CT imaging of the cervical spine was performed without intravenous contrast. Multiplanar CT image reconstructions were also generated. RADIATION DOSE REDUCTION: This exam was performed according to the departmental dose-optimization program which includes automated exposure control, adjustment of the mA and/or kV according to patient size and/or use of iterative reconstruction technique. COMPARISON:  None Available. FINDINGS: Alignment: Normal cervical lordosis. Skull base and vertebrae: No acute fracture. No primary bone lesion or focal pathologic process. Soft tissues and spinal canal: No prevertebral fluid or swelling. No visible canal hematoma. Disc levels: Mild degenerative changes at C5-6. Spinal canal is patent. Upper chest: Visualized lung apices are clear. Other: Visualized thyroid is unremarkable. IMPRESSION: Mild degenerative changes at C5-6.  Otherwise negative. Electronically Signed   By: Julian Hy M.D.   On: 03/28/2022 20:01   DG Chest 2  View  Result Date: 03/28/2022 CLINICAL DATA:  Altered mental status. Neck pain for 1 week. Patient tested positive for nfluenza EXAM: CHEST - 2 VIEW COMPARISON:  Chest radiograph 04/19/2019,CT angio chest 04/18/2019 FINDINGS: New mild elevation of the right hemidiaphragm. The heart size and mediastinal contours are within normal limits. No focal consolidation, pleural effusion, or pneumothorax. Diffuse osteopenia. Compression fracture of a midthoracic vertebral body with approximately 50% vertebral body height loss; this finding is new compared to prior CT chest 04/18/2019. Anterior wedging of several distal thoracic vertebral bodies. Old fractures of the posterolateral left seventh, eighth, and ninth ribs. IMPRESSION: 1. No acute cardiopulmonary abnormality. 2. Age-indeterminate compression fracture of a midthoracic vertebral body, new compared to most recent prior imaging on 04/18/2019. 3. New mild elevation of the right hemidiaphragm. Electronically Signed   By: Ileana Roup M.D.   On: 03/28/2022 14:20    Microbiology: Results for orders placed or performed during the hospital encounter of 03/28/22  Resp  panel by RT-PCR (RSV, Flu A&B, Covid) Anterior Nasal Swab     Status: None   Collection Time: 03/28/22  7:21 PM   Specimen: Anterior Nasal Swab  Result Value Ref Range Status   SARS Coronavirus 2 by RT PCR NEGATIVE NEGATIVE Final    Comment: (NOTE) SARS-CoV-2 target nucleic acids are NOT DETECTED.  The SARS-CoV-2 RNA is generally detectable in upper respiratory specimens during the acute phase of infection. The lowest concentration of SARS-CoV-2 viral copies this assay can detect is 138 copies/mL. A negative result does not preclude SARS-Cov-2 infection and should not be used as the sole basis for treatment or other patient management decisions. A negative result may occur with  improper specimen collection/handling, submission of specimen other than nasopharyngeal swab, presence of viral  mutation(s) within the areas targeted by this assay, and inadequate number of viral copies(<138 copies/mL). A negative result must be combined with clinical observations, patient history, and epidemiological information. The expected result is Negative.  Fact Sheet for Patients:  EntrepreneurPulse.com.au  Fact Sheet for Healthcare Providers:  IncredibleEmployment.be  This test is no t yet approved or cleared by the [place] FDA and  has been authorized for detection and/or diagnosis of SARS-CoV-2 by FDA under an Emergency Use Authorization (EUA). This EUA will remain  in effect (meaning this test can be used) for the duration of the COVID-19 declaration under Section 564(b)(1) of the Act, 21 U.S.C.section 360bbb-3(b)(1), unless the authorization is terminated  or revoked sooner.       Influenza A by PCR NEGATIVE NEGATIVE Final   Influenza B by PCR NEGATIVE NEGATIVE Final    Comment: (NOTE) The Xpert Xpress SARS-CoV-2/FLU/RSV plus assay is intended as an aid in the diagnosis of influenza from Nasopharyngeal swab specimens and should not be used as a sole basis for treatment. Nasal washings and aspirates are unacceptable for Xpert Xpress SARS-CoV-2/FLU/RSV testing.  Fact Sheet for Patients: EntrepreneurPulse.com.au  Fact Sheet for Healthcare Providers: IncredibleEmployment.be  This test is not yet approved or cleared by the [place] FDA and has been authorized for detection and/or diagnosis of SARS-CoV-2 by FDA under an Emergency Use Authorization (EUA). This EUA will remain in effect (meaning this test can be used) for the duration of the COVID-19 declaration under Section 564(b)(1) of the Act, 21 U.S.C. section 360bbb-3(b)(1), unless the authorization is terminated or revoked.     Resp Syncytial Virus by PCR NEGATIVE NEGATIVE Final    Comment: (NOTE) Fact Sheet for  Patients: EntrepreneurPulse.com.au  Fact Sheet for Healthcare Providers: IncredibleEmployment.be  This test is not yet approved or cleared by the [place] FDA and has been authorized for detection and/or diagnosis of SARS-CoV-2 by FDA under an Emergency Use Authorization (EUA). This EUA will remain in effect (meaning this test can be used) for the duration of the COVID-19 declaration under Section 564(b)(1) of the Act, 21 U.S.C. section 360bbb-3(b)(1), unless the authorization is terminated or revoked.  Performed at [facility], [address] [place], [place] [address]     Labs: CBC: Recent Labs  Lab 03/28/22 1314 03/29/22 0446  WBC 15.8* 12.5*  NEUTROABS 13.5*  --   HGB 12.6 10.1*  HCT 39.4 30.4*  MCV 94.9 91.6  PLT 467* 366*   Basic Metabolic Panel: Recent Labs  Lab 03/28/22 1314 03/29/22 0444 03/29/22 0446  NA 134*  --  136  K 3.2*  --  3.3*  CL 93*  --  102  CO2 24  --  26  GLUCOSE 130*  --  97  BUN 13  --  13  CREATININE 0.53  --  0.48  CALCIUM 9.3  --  8.6*  MG  --  1.9  --    Liver Function Tests: Recent Labs  Lab 03/28/22 1314  AST 23  ALT 19  ALKPHOS 64  BILITOT 1.2  PROT 8.7*  ALBUMIN 3.1*   CBG: No results for input(s): "GLUCAP" in the last 168 hours.  Discharge time spent: greater than 30 minutes.  This record has been created using Systems analyst. Errors have been sought and corrected,but may not always be located. Such creation errors do not reflect on the standard of care.   Signed: Lorella Nimrod, MD [facility] 03/29/2022

## 2022-03-30 LAB — URINE CULTURE: Culture: NO GROWTH

## 2022-04-14 ENCOUNTER — Inpatient Hospital Stay
Admission: EM | Admit: 2022-04-14 | Discharge: 2022-04-16 | DRG: 378 | Disposition: A | Discharge status: Home Health | Payer: Medicare Other | Source: Skilled Nursing Facility | Attending: Student in an Organized Health Care Education/Training Program | Admitting: Student in an Organized Health Care Education/Training Program

## 2022-04-14 ENCOUNTER — Other Ambulatory Visit: Payer: Self-pay

## 2022-04-14 ENCOUNTER — Encounter: Payer: Self-pay | Admitting: Internal Medicine

## 2022-04-14 DIAGNOSIS — Z85828 Personal history of other malignant neoplasm of skin: Secondary | ICD-10-CM

## 2022-04-14 DIAGNOSIS — F0390 Unspecified dementia without behavioral disturbance: Secondary | ICD-10-CM | POA: Diagnosis present

## 2022-04-14 DIAGNOSIS — I2510 Atherosclerotic heart disease of native coronary artery without angina pectoris: Secondary | ICD-10-CM | POA: Diagnosis present

## 2022-04-14 DIAGNOSIS — I480 Paroxysmal atrial fibrillation: Secondary | ICD-10-CM | POA: Diagnosis present

## 2022-04-14 DIAGNOSIS — Z9181 History of falling: Secondary | ICD-10-CM

## 2022-04-14 DIAGNOSIS — K922 Gastrointestinal hemorrhage, unspecified: Principal | ICD-10-CM | POA: Diagnosis present

## 2022-04-14 DIAGNOSIS — I420 Dilated cardiomyopathy: Secondary | ICD-10-CM | POA: Diagnosis present

## 2022-04-14 DIAGNOSIS — E871 Hypo-osmolality and hyponatremia: Secondary | ICD-10-CM | POA: Diagnosis present

## 2022-04-14 DIAGNOSIS — D62 Acute posthemorrhagic anemia: Secondary | ICD-10-CM | POA: Diagnosis present

## 2022-04-14 DIAGNOSIS — Z79899 Other long term (current) drug therapy: Secondary | ICD-10-CM

## 2022-04-14 DIAGNOSIS — K2971 Gastritis, unspecified, with bleeding: Secondary | ICD-10-CM | POA: Diagnosis present

## 2022-04-14 DIAGNOSIS — I5022 Chronic systolic (congestive) heart failure: Secondary | ICD-10-CM | POA: Diagnosis present

## 2022-04-14 DIAGNOSIS — Z1152 Encounter for screening for COVID-19: Secondary | ICD-10-CM | POA: Diagnosis not present

## 2022-04-14 DIAGNOSIS — K625 Hemorrhage of anus and rectum: Secondary | ICD-10-CM | POA: Diagnosis present

## 2022-04-14 DIAGNOSIS — D638 Anemia in other chronic diseases classified elsewhere: Secondary | ICD-10-CM | POA: Diagnosis present

## 2022-04-14 DIAGNOSIS — F32A Depression, unspecified: Secondary | ICD-10-CM | POA: Diagnosis present

## 2022-04-14 DIAGNOSIS — Z87891 Personal history of nicotine dependence: Secondary | ICD-10-CM

## 2022-04-14 DIAGNOSIS — M810 Age-related osteoporosis without current pathological fracture: Secondary | ICD-10-CM | POA: Diagnosis present

## 2022-04-14 DIAGNOSIS — K449 Diaphragmatic hernia without obstruction or gangrene: Secondary | ICD-10-CM | POA: Diagnosis present

## 2022-04-14 HISTORY — DX: Unspecified dementia, unspecified severity, without behavioral disturbance, psychotic disturbance, mood disturbance, and anxiety: F03.90

## 2022-04-14 HISTORY — DX: Unspecified atrial fibrillation: I48.91

## 2022-04-14 HISTORY — DX: Basal cell carcinoma of skin, unspecified: C44.91

## 2022-04-14 LAB — TYPE AND SCREEN
ABO/RH(D): A POS
Antibody Screen: NEGATIVE

## 2022-04-14 LAB — CBC
HCT: 41.3 % (ref 36.0–46.0)
Hemoglobin: 13.2 g/dL (ref 12.0–15.0)
MCH: 30.1 pg (ref 26.0–34.0)
MCHC: 32 g/dL (ref 30.0–36.0)
MCV: 94.3 fL (ref 80.0–100.0)
Platelets: 376 10*3/uL (ref 150–400)
RBC: 4.38 MIL/uL (ref 3.87–5.11)
RDW: 14.7 % (ref 11.5–15.5)
WBC: 10.4 10*3/uL (ref 4.0–10.5)
nRBC: 0 % (ref 0.0–0.2)

## 2022-04-14 LAB — COMPREHENSIVE METABOLIC PANEL
ALT: 21 U/L (ref 0–44)
AST: 29 U/L (ref 15–41)
Albumin: 3.4 g/dL — ABNORMAL LOW (ref 3.5–5.0)
Alkaline Phosphatase: 70 U/L (ref 38–126)
Anion gap: 17 — ABNORMAL HIGH (ref 5–15)
BUN: 23 mg/dL (ref 8–23)
CO2: 20 mmol/L — ABNORMAL LOW (ref 22–32)
Calcium: 9.3 mg/dL (ref 8.9–10.3)
Chloride: 94 mmol/L — ABNORMAL LOW (ref 98–111)
Creatinine, Ser: 0.76 mg/dL (ref 0.44–1.00)
GFR, Estimated: >60 mL/min (ref >60)
Glucose, Bld: 124 mg/dL — ABNORMAL HIGH (ref 70–99)
Potassium: 4 mmol/L (ref 3.5–5.1)
Sodium: 131 mmol/L — ABNORMAL LOW (ref 135–145)
Total Bilirubin: 0.7 mg/dL (ref 0.3–1.2)
Total Protein: 8.3 g/dL — ABNORMAL HIGH (ref 6.5–8.1)

## 2022-04-14 LAB — PROTIME-INR
INR: 1.4 — ABNORMAL HIGH (ref 0.8–1.2)
Prothrombin Time: 16.9 seconds — ABNORMAL HIGH (ref 11.4–15.2)

## 2022-04-14 MED ORDER — PANTOPRAZOLE 80MG IVPB - SIMPLE MED
8.0000 mg/h | INTRAVENOUS | Status: DC
  Administered 2022-04-14: 8 mg/h via INTRAVENOUS
  Filled 2022-04-14 (×2): qty 100

## 2022-04-14 MED ORDER — PANTOPRAZOLE 80MG IVPB - SIMPLE MED
80.0000 mg | Freq: Once | INTRAVENOUS | Status: AC
  Administered 2022-04-14: 80 mg via INTRAVENOUS

## 2022-04-14 NOTE — ED Notes (Signed)
Patient diaper changed and purewick applied. Patient had dried feces noted to coccyx and vaginal region

## 2022-04-14 NOTE — ED Notes (Signed)
Patient has redness and irritation noted to coccyx and vaginal region and groin region. Small amout of skin sloughing noted to right groin region. Barrier cream applied to vaginal and groin region. Patient has stage two pressure injury noted to coccyx. Mepilex applied to patient. Patient has redness and irritation noted to skin under breast. Barrier cream applied and cloth applied over area/under breast

## 2022-04-14 NOTE — ED Triage Notes (Signed)
Pt here with rectal bleeding. Pt unable to give information about bleeding at this moment.

## 2022-04-14 NOTE — ED Notes (Addendum)
Husband at bedside and states that he was called from the [facility] where the pt lives and stated that the pt was vomiting a black substance this morning. Husband also states that she cannot control her bowels. Husband states that pt has had pneumonia within the last 2 weeks. Pt is A&O x1 and husband says this is worse than her baseline.

## 2022-04-14 NOTE — ED Notes (Signed)
First Nurse Note: Pt to ED via [facility] from [facility] for possible GI bleed. Pt had dark tarry stool. Pt takes iron supplement. Pt is A & O.

## 2022-04-14 NOTE — ED Provider Notes (Signed)
[facility] Provider Note    Event Date/Time   First MD Initiated Contact with Patient 04/14/22 1701     (approximate)  History   Chief Complaint: Rectal Bleeding  HPI  Kimberly Cantu is a 77 y.o. female with a past medical history of CAD, CHF, dementia, presents to the emergency department for rectal bleeding and possible upper GI bleed.  According to the son he was called from patient's nursing facility due to vomiting black vomitus this morning.  He also states for the last 2 days she has had very dark tarry appearing stools.  Here the patient is awake and alert she is not able to contribute to much to her history.  Denies any abdominal pain.  Physical Exam   Triage Vital Signs: ED Triage Vitals  Enc Vitals Group     BP 04/14/22 1234 128/71     Pulse Rate 04/14/22 1234 88     Resp 04/14/22 1234 19     Temp 04/14/22 1234 97.7 F (36.5 C)     Temp Source 04/14/22 1234 Oral     SpO2 04/14/22 1234 99 %     Weight 04/14/22 1234 107 lb (48.5 kg)     Height 04/14/22 1234 '5\' 2"'$  (1.575 m)     Head Circumference --      Peak Flow --      Pain Score 04/14/22 1245 0     Pain Loc --      Pain Edu? --      Excl. in [place]? --     Most recent vital signs: Vitals:   04/14/22 1758 04/14/22 1918  BP: (!) 118/104 (!) 137/112  Pulse: 96 88  Resp: 18 18  Temp: 97.9 F (36.6 C)   SpO2: 99% 99%    General: Awake, no distress.  CV:  Good peripheral perfusion.  Regular rate and rhythm  Resp:  Normal effort.  Equal breath sounds bilaterally.  Abd:  No distention.  Soft, nontender.  No rebound or guarding.  9 abdomen.   ED Results / Procedures / Treatments   MEDICATIONS ORDERED IN ED: Medications  pantoprazole (PROTONIX) 80 mg /NS 100 mL IVPB (has no administration in time range)  pantoprazole (PROTONIX) 80 mg /NS 100 mL IVPB (0 mg Intravenous Stopped 04/14/22 1937)     IMPRESSION / MDM / ASSESSMENT AND PLAN / ED COURSE  I reviewed the triage vital signs  and the nursing notes.  Patient's presentation is most consistent with acute presentation with potential threat to life or bodily function.  Patient presents emergency department for rectal bleeding.  Per report patient had an episode or 2 of very dark vomitus this morning for the past 2 days has had black tarry appearing stools.  Patient's rectal exam shows melena strongly guaiac positive.  Patient CBC reassuringly shows a normal H&H, chemistry shows no concerning findings.  Son denies any anticoagulation.  No history of GI bleed previously.  Given the patient's dark vomitus with melena strongly guaiac positive on exam patient will require admission to the hospital service for ongoing workup and treatment and likely GI consultation.  I have started the patient on Protonix bolus and infusion.  Patient remains hemodynamically stable in the emergency department, no vomiting for at least the past 8 hours since the patient has been in the emergency department.  FINAL CLINICAL IMPRESSION(S) / ED DIAGNOSES   GI bleed  Note:  This document was prepared using Dragon voice recognition software and may  include unintentional dictation errors.   Harvest Dark, MD 04/14/22 2011

## 2022-04-14 NOTE — H&P (Signed)
History and Physical    Patient: Kimberly Cantu HCW:[id number] DOB: 04-16-1945 DOA: 04/14/2022 DOS: the patient was seen and examined on 04/14/2022 PCP: Gladstone Lighter, MD  Patient coming from: ALF/ILF  Chief Complaint:  Chief Complaint  Patient presents with   Rectal Bleeding   HPI: Kimberly Cantu is a 77 y.o. female with medical history significant of dementia, Afib, CHF (systolic) and anemia in the past requiring admission, no prior colonoscopy or EGD, presents with coffee ground emesis x1 today, and dark stool per daughter.  Pt has obvious dementia and is unable to give history but doesn't appear to complain of abdominal pain, diarrhea, constipation, brbpr, dysuria, hematuria.  Pt has had prior anemia and at the time her daughter says that GI didn't think endoscopy would be safe.  Pt is not taking aspirin or NSAIDS per daughter   In ED, T 97.9, P 128, Bp 138/119,  pox 95% on RA,    Wbc 10.4, hgfb 13.2, Plt 376   Na 131, K 4.0,  Bun 23, Creat 0.76, Ast 29, Alt 21 INR 1.4  Pt given protonix iv in ED,  pt will be admitted for GI bleeding.  GI consult requested to [facility].      Review of Systems:  Past Medical History:  Diagnosis Date   Anemia    Atrial fibrillation ([place])    Basal cell carcinoma    CHF (congestive heart failure) (HCC)    EF 25%   Dementia (HCC)    Past Surgical History:  Procedure Laterality Date   HIP PINNING Left    Social History:  reports that she has quit smoking. She has never used smokeless tobacco. She reports that she does not drink alcohol and does not use drugs.  No Known Allergies  History reviewed. No pertinent family history.  Prior to Admission medications   Medication Sig Start Date End Date Taking? Authorizing Provider  ALPRAZolam Duanne Moron) 0.5 MG tablet Take 0.5 tablets (0.25 mg total) by mouth 3 (three) times daily as needed for up to 30 doses for anxiety or sleep. 04/21/19   Amin, Jeanella Flattery, MD  Cholecalciferol (VITAMIN D-1000  MAX ST) 25 MCG (1000 UT) tablet Take 1,000 Units by mouth daily.    [provider]  cyanocobalamin (VITAMIN B12) 1000 MCG tablet Take 1,000 mcg by mouth daily.    [provider]  donepezil (ARICEPT) 10 MG tablet Take 10 mg by mouth at bedtime. 02/17/22   [provider]  ferrous sulfate 325 (65 FE) MG tablet Take 1 tablet (325 mg total) by mouth daily. 04/21/19 03/28/22  Amin, Jeanella Flattery, MD  furosemide (LASIX) 40 MG tablet Take 1 tablet (40 mg total) by mouth daily. 04/21/19   Amin, Jeanella Flattery, MD  ibandronate (BONIVA) 150 MG tablet Take 150 mg by mouth every 30 (thirty) days. 01/13/22 01/13/23  [provider]  metoprolol succinate (TOPROL-XL) 25 MG 24 hr tablet Take 1 tablet (25 mg total) by mouth daily. 04/21/19   Amin, Jeanella Flattery, MD  Multiple Vitamin (MULTIVITAMIN WITH MINERALS) TABS tablet Take 1 tablet by mouth daily. 04/21/19   Amin, Jeanella Flattery, MD  polyethylene glycol (MIRALAX / GLYCOLAX) 17 g packet Take 17 g by mouth daily as needed for moderate constipation or severe constipation. 04/21/19   Amin, Jeanella Flattery, MD  potassium chloride SA (KLOR-CON) 20 MEQ tablet Take 1 tablet (20 mEq total) by mouth daily. 04/21/19   Amin, Ankit Chirag, MD  senna-docusate (SENOKOT-S) 8.6-50 MG tablet Take 2 tablets  by mouth at bedtime as needed for mild constipation or moderate constipation. 04/21/19   Amin, Jeanella Flattery, MD  sertraline (ZOLOFT) 50 MG tablet Take 50 mg by mouth 2 (two) times daily.    [provider]  vismodegib (ERIVEDGE) 150 MG capsule Take 150 mg by mouth daily. 07/10/20   [provider]    Physical Exam: Vitals:   04/14/22 1245 04/14/22 1758 04/14/22 1918 04/14/22 2018  BP:  (!) 118/104 (!) 137/112 (!) 142/85  Pulse:  96 88 89  Resp:  '18 18 17  '$ Temp:  97.9 F (36.6 C)  97.8 F (36.6 C)  TempSrc:  Oral  Oral  SpO2:  99% 99% 100%  Weight: 48.5 kg     Height: '5\' 2"'$  (1.575 m)      Heent: anicteric Neck: no jvd Heart: rrr s1,  s2,  Lung: CTAB Abd: soft, nt, nd, +bs Ext: no c/c/e Neuro: nonfocal, pt axox1( only person) Skin: no rash  Data Reviewed:   Assessment and Plan: R/o UGI bleeding  Boniva is only risk factor for UGI bleeding NPO Protonix iv Cbc q6h x4 GI consulted (Dr. Alice Reichert) notified and will consult  CHF (systolic) Hold Lasix since NPO for now Cont Metoprolol XL '25mg'$  po qday  Hx of Afib Not on anticoagulation, probably due to fall risk and patient being adverse to medication   Hx of osteoporosis Hold Boniva  Dementia Hold Aricept since NPO Hold Zoloft since NPO   Advance Care Planning:   Code Status: Prior FULL CODE, pt was DNR/DNI last admission, I attempted to contact husband x2, no response  Consults: GI  Family Communication: w daughter and husband  Severity of Illness: The appropriate patient status for this patient is INPATIENT. Inpatient status is judged to be reasonable and necessary in order to provide the required intensity of service to ensure the patient's safety. The patient's presenting symptoms, physical exam findings, and initial radiographic and laboratory data in the context of their chronic comorbidities is felt to place them at high risk for further clinical deterioration. Furthermore, it is not anticipated that the patient will be medically stable for discharge from the hospital within 2 midnights of admission.   * I certify that at the point of admission it is my clinical judgment that the patient will require inpatient hospital care spanning beyond 2 midnights from the point of admission due to high intensity of service, high risk for further deterioration and high frequency of surveillance required.*  Author: Jani Gravel, MD 04/14/2022 9:53 PM  For on call review [email or web address].

## 2022-04-15 ENCOUNTER — Inpatient Hospital Stay: Payer: Medicare Other | Admitting: Anesthesiology

## 2022-04-15 ENCOUNTER — Encounter
Admission: EM | Disposition: A | Discharge status: Home Health | Payer: Self-pay | Source: Skilled Nursing Facility | Attending: Internal Medicine

## 2022-04-15 DIAGNOSIS — K922 Gastrointestinal hemorrhage, unspecified: Principal | ICD-10-CM

## 2022-04-15 DIAGNOSIS — D62 Acute posthemorrhagic anemia: Secondary | ICD-10-CM | POA: Diagnosis not present

## 2022-04-15 HISTORY — PX: ESOPHAGOGASTRODUODENOSCOPY (EGD) WITH PROPOFOL: SHX5813

## 2022-04-15 LAB — BASIC METABOLIC PANEL
Anion gap: 11 (ref 5–15)
BUN: 23 mg/dL (ref 8–23)
CO2: 23 mmol/L (ref 22–32)
Calcium: 8.1 mg/dL — ABNORMAL LOW (ref 8.9–10.3)
Chloride: 102 mmol/L (ref 98–111)
Creatinine, Ser: 0.71 mg/dL (ref 0.44–1.00)
GFR, Estimated: >60 mL/min (ref >60)
Glucose, Bld: 70 mg/dL (ref 70–99)
Potassium: 4.1 mmol/L (ref 3.5–5.1)
Sodium: 136 mmol/L (ref 135–145)

## 2022-04-15 LAB — CBC
HCT: 30.7 % — ABNORMAL LOW (ref 36.0–46.0)
HCT: 30.1 % — ABNORMAL LOW (ref 36.0–46.0)
Hemoglobin: 9.8 g/dL — ABNORMAL LOW (ref 12.0–15.0)
Hemoglobin: 9.6 g/dL — ABNORMAL LOW (ref 12.0–15.0)
MCH: 29.9 pg (ref 26.0–34.0)
MCH: 30 pg (ref 26.0–34.0)
MCHC: 31.9 g/dL (ref 30.0–36.0)
MCHC: 31.9 g/dL (ref 30.0–36.0)
MCV: 93.6 fL (ref 80.0–100.0)
MCV: 94.1 fL (ref 80.0–100.0)
Platelets: 326 10*3/uL (ref 150–400)
Platelets: 305 10*3/uL (ref 150–400)
RBC: 3.28 MIL/uL — ABNORMAL LOW (ref 3.87–5.11)
RBC: 3.2 MIL/uL — ABNORMAL LOW (ref 3.87–5.11)
RDW: 14.8 % (ref 11.5–15.5)
RDW: 15.1 % (ref 11.5–15.5)
WBC: 8.1 10*3/uL (ref 4.0–10.5)
WBC: 8.6 10*3/uL (ref 4.0–10.5)
nRBC: 0 % (ref 0.0–0.2)
nRBC: 0 % (ref 0.0–0.2)

## 2022-04-15 SURGERY — ESOPHAGOGASTRODUODENOSCOPY (EGD) WITH PROPOFOL
Anesthesia: General

## 2022-04-15 MED ORDER — LIDOCAINE 2% (20 MG/ML) 5 ML SYRINGE
INTRAMUSCULAR | Status: DC | PRN
  Administered 2022-04-15: 50 mg via INTRAVENOUS

## 2022-04-15 MED ORDER — SODIUM CHLORIDE 0.9 % IV SOLN: INTRAVENOUS | Status: AC

## 2022-04-15 MED ORDER — STERILE WATER FOR IRRIGATION IR SOLN
Status: DC | PRN
  Administered 2022-04-15: 50 mL

## 2022-04-15 MED ORDER — PROPOFOL 500 MG/50ML IV EMUL
INTRAVENOUS | Status: DC | PRN
  Administered 2022-04-15: 150 ug/kg/min via INTRAVENOUS

## 2022-04-15 MED ORDER — ACETAMINOPHEN 325 MG PO TABS: 650.0000 mg | ORAL_TABLET | Freq: Four times a day (QID) | ORAL | Status: DC | PRN

## 2022-04-15 MED ORDER — ONDANSETRON HCL 4 MG/2ML IJ SOLN: 4.0000 mg | Freq: Four times a day (QID) | INTRAMUSCULAR | Status: DC | PRN

## 2022-04-15 MED ORDER — ONDANSETRON HCL 4 MG PO TABS: 4.0000 mg | ORAL_TABLET | Freq: Four times a day (QID) | ORAL | Status: DC | PRN

## 2022-04-15 MED ORDER — ACETAMINOPHEN 325 MG RE SUPP: 650.0000 mg | Freq: Four times a day (QID) | RECTAL | Status: DC | PRN

## 2022-04-15 MED ORDER — PROPOFOL 10 MG/ML IV BOLUS
INTRAVENOUS | Status: DC | PRN
  Administered 2022-04-15: 50 mg via INTRAVENOUS

## 2022-04-15 NOTE — ED Notes (Signed)
Update to [place], daughter, by Therapist, sports.

## 2022-04-15 NOTE — Progress Notes (Signed)
PROGRESS NOTE  Kimberly Cantu    DOB: 09-Aug-1945, 77 y.o.  BUL:[id number]    Code Status: Full Code   DOA: 04/14/2022   LOS: 1   Brief hospital course  Kimberly Cantu is a 77 y.o. female with a PMH significant for dementia, Afib, CHF (systolic) and anemia in the past requiring admission, no prior colonoscopy or EGD.  They presented from ALF/ILF to the ED on 04/14/2022 with melena and coffee ground emesis x 1 days. Denies h/o NSAID use recently.   In the ED, it was found that they had T 97.9, P 128, Bp 138/119, pox 95% on RA .  Significant findings included Wbc 10.4, hgfb 13.2, Plt 376   Na 131, K 4.0,  Bun 23, Creat 0.76, Ast 29, Alt 21, INR 1.4.  They were initially treated with IV protonix, supportive care. GI was consulted.   Patient was admitted to medicine service for further workup and management of GI bleed as outlined in detail below.  04/15/22 -stable  Assessment & Plan  Principal Problem:   GI bleeding Active Problems:   Hyponatremia   Chronic systolic CHF (congestive heart failure) (HCC)   Acute upper GI bleeding  GI bleed- no new occurrence this am. Hgb greatly reduced since presentation. Hgb 13.2>9.8. transfusion threshold <7.0. hemodynamically stable - GI following, appreciate recs  - EGD, colonoscopy today  - mechanical gastritis, hiatal hernia - follow CBC  HFrEF- echo 04/2019 shows EF 25-30% with global LV hypokinesis  - restart home medications once able to tolerate PO  - metoprolol, lasix  Afib- not on anticoagulation at baseline. Currently rate controlled without medication - restart metoprolol when able  H/o osteoporosis  - hold home boniva  Dementia without disturbance  - continue home meds when able to take PO  Body mass index is 19.56 kg/m.  VTE ppx: SCDs Start: 04/15/22 0254  Diet:     Diet   Diet NPO time specified   Consultants: GI  Subjective 04/15/22    Pt reports no complaints. Sedated after procedure.   Objective    Vitals:   04/15/22 0530 04/15/22 0600 04/15/22 0630 04/15/22 0700  BP: (!) 130/57 (!) 108/51 (!) 115/57 129/61  Pulse: 82 83 82 87  Resp: 17 17 (!) 22 17  Temp:      TempSrc:      SpO2: 95% 97% 97% 99%  Weight:      Height:        Intake/Output Summary (Last 24 hours) at 04/15/2022 0839 Last data filed at 04/14/2022 1937 Gross per 24 hour  Intake 100 ml  Output --  Net 100 ml   Filed Weights   04/14/22 1234 04/14/22 1245  Weight: 48.5 kg 48.5 kg     Physical Exam:  General: awake, alert, NAD HEENT: atraumatic, clear conjunctiva, anicteric sclera, MMM Respiratory: normal respiratory effort. Cardiovascular: quick capillary refill Gastrointestinal: soft, NT, ND Nervous: A&O x1. no gross focal neurologic deficits Extremities: moves all equally, no edema, normal tone Skin: dry, intact, normal temperature, normal color. No rashes, lesions or ulcers on exposed skin  Labs   I have personally reviewed the following labs and imaging studies CBC    Component Value Date/Time   WBC 8.1 04/15/2022 0515   RBC 3.28 (L) 04/15/2022 0515   HGB 9.8 (L) 04/15/2022 0515   HCT 30.7 (L) 04/15/2022 0515   PLT 326 04/15/2022 0515   MCV 93.6 04/15/2022 0515   MCH 29.9 04/15/2022 0515   MCHC 31.9 04/15/2022  0515   RDW 14.8 04/15/2022 0515   LYMPHSABS 0.8 03/28/2022 1314   MONOABS 1.4 (H) 03/28/2022 1314   EOSABS 0.0 03/28/2022 1314   BASOSABS 0.0 03/28/2022 1314      Latest Ref Rng & Units 04/15/2022    5:15 AM 04/14/2022   12:35 PM 03/29/2022    4:46 AM  BMP  Glucose 70 - 99 mg/dL 70  124  97   BUN 8 - 23 mg/dL '23  23  13   '$ Creatinine 0.44 - 1.00 mg/dL 0.71  0.76  0.48   Sodium 135 - 145 mmol/L 136  131  136   Potassium 3.5 - 5.1 mmol/L 4.1  4.0  3.3   Chloride 98 - 111 mmol/L 102  94  102   CO2 22 - 32 mmol/L '23  20  26   '$ Calcium 8.9 - 10.3 mg/dL 8.1  9.3  8.6     Disposition Plan & Communication  Patient status: Inpatient  Admitted From: Home Planned disposition location:  Home Anticipated discharge date: 1/6 pending stabilization  Family Communication: none at bedside    Author: Richarda Osmond, DO [facility] 04/15/2022, 8:39 AM   Available by Epic secure chat 7AM-7PM. If 7PM-7AM, please contact night-coverage.  [facility] contact information found on [email or web address].

## 2022-04-15 NOTE — Transfer of Care (Signed)
Immediate Anesthesia Transfer of Care Note  Patient: Kimberly Cantu  Procedure(s) Performed: ESOPHAGOGASTRODUODENOSCOPY (EGD) WITH PROPOFOL  Patient Location: Endoscopy Unit  Anesthesia Type:General  Level of Consciousness: sedated  Airway & Oxygen Therapy: Patient Spontanous Breathing  Post-op Assessment: Report given to RN and Post -op Vital signs reviewed and stable  Post vital signs: Reviewed and stable  Last Vitals:  Vitals Value Taken Time  BP 106/89 04/15/22 1327  Temp    Pulse 97 04/15/22 1327  Resp 15 04/15/22 1327  SpO2 99 % 04/15/22 1327    Last Pain:  Vitals:   04/15/22 1247  TempSrc: Temporal  PainSc: 0-No pain         Complications: No notable events documented.

## 2022-04-15 NOTE — Op Note (Signed)
[facility] Patient Name: Kimberly Cantu Procedure Date: 04/15/2022 12:36 PM MRN: [id number] Account #: [id number] Date of Birth: 1945-04-21 Admit Type: Inpatient Age: 77 Room: [facility] ENDO ROOM 1 Gender: Female Note Status: Finalized Instrument Name: Upper Endoscope [id number] Procedure:             Upper GI endoscopy Indications:           Coffee-ground emesis Providers:             Lorie Apley K. Alice Reichert MD, MD Referring MD:          Gladstone Lighter, MD (Referring MD) Medicines:             Propofol per Anesthesia Complications:         No immediate complications. Estimated blood loss: None. Procedure:             Pre-Anesthesia Assessment:                        - The risks and benefits of the procedure and the                         sedation options and risks were discussed with the                         patient. All questions were answered and informed                         consent was obtained.                        - Patient identification and proposed procedure were                         verified prior to the procedure by the nurse. The                         procedure was verified in the procedure room.                        - ASA Grade Assessment: III - A patient with severe                         systemic disease.                        - After reviewing the risks and benefits, the patient                         was deemed in satisfactory condition to undergo the                         procedure.                        After obtaining informed consent, the endoscope was                         passed under direct vision. Throughout the procedure,  the patient's blood pressure, pulse, and oxygen                         saturations were monitored continuously. The Endoscope                         was introduced through the mouth, and advanced to the                         third part of duodenum. The upper  GI endoscopy was                         accomplished without difficulty. The patient tolerated                         the procedure well. Findings:      The esophagus was normal.      Retching injury in the fundus compatible with 'mechanical' gastritis,       Estimated blood loss: none.      A 2 cm hiatal hernia was present.      No other significant abnormalities were identified in a careful       examination of the stomach.      The examined duodenum was normal.      The exam was otherwise without abnormality. Impression:            - Normal esophagus.                        - 2 cm hiatal hernia.                        - Mild retching injury "mechanical gastritis" in the                         fundus and body of the stomach. NO stigmata of                         bleeding.                        - Normal examined duodenum.                        - The examination was otherwise normal.                        - No specimens collected. Recommendation:        - Return patient to hospital ward for possible                         discharge same day.                        - Advance diet as tolerated.                        - Supportive therapy                        - [facility] will sign off for now. Call us back if any  changes clinically or if we can help for any reason. Procedure Code(s):     --- Professional ---                        [phone], Esophagogastroduodenoscopy, flexible,                         transoral; diagnostic, including collection of                         specimen(s) by brushing or washing, when performed                         (separate procedure) Diagnosis Code(s):     --- Professional ---                        K92.0, Hematemesis                        K44.9, Diaphragmatic hernia without obstruction or                         gangrene CPT copyright 2022 American Medical Association. All rights reserved. The codes documented in this report  are preliminary and upon coder review may  be revised to meet current compliance requirements. Efrain Sella MD, MD 04/15/2022 1:25:27 PM This report has been signed electronically. Number of Addenda: 0 Note Initiated On: 04/15/2022 12:36 PM Estimated Blood Loss:  Estimated blood loss: none.      [facility]

## 2022-04-15 NOTE — Consult Note (Addendum)
GI Inpatient Consult Note  Reason for Consult: UGIB   Attending Requesting Consult: Dr. Jani Gravel  History of Present Illness: Kimberly Cantu is a 77 y.o. female seen for evaluation of UGIB at the request of admitting hospitalist - Dr. Jani Gravel. Patient has a PMH of  ementia, chronic systolic CHF, dilated cardiomyopathy, hx of BCC os skin of RUE including shoulder, paroxsymal atrial fibrillation not on chronic anticoagulation, anxiety, and chronic anemia. She presented to the [facility] ED yesterday afternoon via EMS from her residence at [place] for concerns of 2-day history of black, tarry stools and 1 episode of coffee ground emesis. Upon presentation to the ED, all vital signs were stable. Labs significant for hemoglobin 13.2, BUN 23, serum creatinine 0.76, INR 1.4, WBC 10.4, and sodium 131. Per ED physician, rectal exam showed melanotic stool which was guaiac positive. She was given Protonix IV bolus and gtt and GI consult made this morning.   Patient seen and examined resting comfortably in hospital bed. No acute events overnight. No family at bedside, but I did call daughter, [name], and we spoke on the telephone. Patient unable to contribute much to the history based on baseline dementia. She is alerted and oriented only to self. She denies any obvious abdominal pain, nausea, fevers, chills, or recurrent vomiting. There are no reports of further melanotic stool or hematemesis episodes. Daughter reports that patient has never had EGD or colonoscopy. She was seen in the [facility] ED just over 2 weeks ago for Influenza A. There is no history of GI bleed per daughter's report. She does take oral iron supplement daily. She has history of anemia. Hemoglobin 2 weeks ago while at ED for Influenza was 12.6 --> 10.1.    Past Medical History:  Past Medical History:  Diagnosis Date   Anemia    Atrial fibrillation ([place])    Basal cell carcinoma    CHF (congestive heart failure) (HCC)    EF  25%   Dementia ([place])     Problem List: Patient Active Problem List   Diagnosis Date Noted   GI bleeding 70/62/3762   Chronic systolic CHF (congestive heart failure) ([place]) 04/14/2022   Acute upper GI bleeding 04/14/2022   Influenza A 03/28/2022   Acute lower UTI 83/15/1761   Acute metabolic encephalopathy 60/73/7106   Dementia without behavioral disturbance ([place]) 03/28/2022   Anxiety and depression 03/28/2022   Hypokalemia 03/28/2022   Hyponatremia 03/28/2022   Hospice care patient 04/21/2019   Elevated troponin level not due to acute coronary syndrome 04/20/2019   Goals of care, counseling/discussion 04/20/2019   Atrial fibrillation with RVR ([place]) 26/94/8546   Acute systolic CHF (congestive heart failure) ([place]) 04/19/2019   Mass of chest wall, right 04/19/2019   Acute respiratory distress 04/19/2019   Acute respiratory failure with hypoxia and hypercapnia (HCC) 04/19/2019   Bilateral pleural effusion 04/19/2019   Acute hypoxemic respiratory failure ([place]) 04/19/2019   Protein-calorie malnutrition, severe 04/19/2019   Severe anemia 04/18/2019    Past Surgical History: Past Surgical History:  Procedure Laterality Date   HIP PINNING Left     Allergies: No Known Allergies   Home Medications: (Not in a hospital admission)  Home medication reconciliation was completed with the patient.   Continuous Inpatient Infusions:    sodium chloride 50 mL/hr at 04/15/22 0317   pantoprazole Stopped (04/15/22 0710)    PRN Inpatient Medications:  acetaminophen **OR** acetaminophen, ondansetron **OR** ondansetron (ZOFRAN) IV  Family History: family history is not  on file.  The patient's family history is negative for inflammatory bowel disorders, GI malignancy, or solid organ transplantation.  Social History:   reports that she has quit smoking. She has never used smokeless tobacco. She reports that she does not drink alcohol and does not use drugs. The patient denies ETOH, tobacco,  or drug use.   Review of Systems:  Unable to contribute due to patient's baseline dementia.    Physical Examination: BP 94/65 (BP Location: Right Arm)   Pulse 88   Temp 98.3 F (36.8 C) (Axillary)   Resp 18   Ht '5\' 2"'$  (1.575 m)   Wt 48.5 kg   SpO2 100%   BMI 19.56 kg/m  Gen: NAD, alert and oriented x1 to self HEENT: PEERLA, EOMI, Neck: supple, no JVD or thyromegaly Chest: CTA bilaterally, no wheezes, crackles, or other adventitious sounds CV: RRR, no m/g/c/r Abd: soft, NT, ND, +BS in all four quadrants; no HSM, guarding, ridigity, or rebound tenderness Ext: no edema, well perfused with 2+ pulses, Skin: bruising noted to chin  Lymph: no LAD  Data: Lab Results  Component Value Date   WBC 8.1 04/15/2022   HGB 9.8 (L) 04/15/2022   HCT 30.7 (L) 04/15/2022   MCV 93.6 04/15/2022   PLT 326 04/15/2022   Recent Labs  Lab 04/14/22 1235 04/15/22 0515  HGB 13.2 9.8*   Lab Results  Component Value Date   NA 136 04/15/2022   K 4.1 04/15/2022   CL 102 04/15/2022   CO2 23 04/15/2022   BUN 23 04/15/2022   CREATININE 0.71 04/15/2022   Lab Results  Component Value Date   ALT 21 04/14/2022   AST 29 04/14/2022   ALKPHOS 70 04/14/2022   BILITOT 0.7 04/14/2022   Recent Labs  Lab 04/14/22 1339  INR 1.4*    Assessment/Plan:  77 y/o Caucasian female with a PMH of dementia, chronic systolic CHF, dilated cardiomyopathy, hx of BCC os skin of RUE including shoulder, paroxsymal atrial fibrillation, anxiety, and chronic anemia presented to the [facility] ED for reported 2-day history of melanotic stools and 1 episode of coffee-ground emesis.   GI bleed - reports of 2-day history of melena and 1 episode of coffee-ground emesis suggestive of UGIB. DDx includes peptic ulcer disease, gastritis +/- H pylori, AVMs, duodenitis, erosive esophagitis, esophageal or gastric neoplasm, polyp, right colon source, etc. Hemoglobin dropped from 13.2 --> 9.8 overnight. No overt gastrointestinal  bleeding.  Dementia   Chronic anemia - likely 2/2 anemia of chronic disease   PAF - NOT on chronic anticoagulation  Chronic systolic CHF  Recommendations:  - H&H stable currently with no overt gastrointestinal bleeding - Maintain 2 large bore IVs for access - Continue to monitor serial H&H. Transfuse for Hgb <7.0. - Continue Protonix gtt for gastric protection  - Discussed with daughter about plan of care and options to continue clinical observation versus endoscopy with potential endoscopic hemostasis as next steps. I have called and spoken with patient's husband and HPOA, Konrad Dolores, who also verbally consented to EGD this afternoon.  - EGD today with Dr. Alice Reichert - See procedure note for findings and further recommendations  - NPO   I reviewed the risks (including bleeding, perforation, infection, anesthesia complications, cardiac/respiratory complications), benefits and alternatives of EGD. Patient's husband (HPOA - Konrad Dolores) consents to proceed.     Thank you for the consult. Please call with questions or concerns.  Geanie Kenning, PA-C [facility] [phone]

## 2022-04-15 NOTE — Anesthesia Preprocedure Evaluation (Addendum)
Anesthesia Evaluation  Patient identified by MRN, date of birth, ID band Patient awake    Reviewed: Allergy & Precautions, NPO status , Patient's Chart, lab work & pertinent test results  History of Anesthesia Complications Negative for: history of anesthetic complications  Airway Mallampati: IV   Neck ROM: Full    Dental  (+) Missing   Pulmonary former smoker   Pulmonary exam normal breath sounds clear to auscultation       Cardiovascular +CHF (dilated cardiomyopathy, EF 25-30%)  Normal cardiovascular exam+ dysrhythmias (a fib)  Rhythm:Regular Rate:Normal     Neuro/Psych  PSYCHIATRIC DISORDERS     Dementia negative neurological ROS     GI/Hepatic negative GI ROS,,,  Endo/Other  negative endocrine ROS    Renal/GU negative Renal ROS     Musculoskeletal   Abdominal   Peds  Hematology  (+) Blood dyscrasia, anemia   Anesthesia Other Findings Cardiology note 02/15/22:  77 y.o. female with  1. Dilated cardiomyopathy (CMS-HCC)  2. Chronic systolic HF (heart failure) (CMS-HCC)  3. Atrial fibrillation with RVR (CMS-HCC)  4. 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 Patient was discharged home on maintenance furosemide, started on metoprolol succinate, but not started on ACEi or ARB due to low blood pressure.  Plan   1. Continue current medications 2. Counseled patient about low-sodium diet 3. DASH diet printed instructions given to the patient 4. Instructed patient to keep her feet elevated when sitting 5. Return to clinic for follow-up in 6 months  No orders of the defined types were placed in this encounter.  Return in about 6 months (around 08/16/2022).     Reproductive/Obstetrics                             Anesthesia Physical Anesthesia Plan  ASA: 3  Anesthesia Plan: General   Post-op Pain Management:    Induction: Intravenous  PONV Risk Score and Plan: 3 and Propofol infusion, TIVA and Treatment may vary due to age or medical condition  Airway Management Planned: Natural Airway  Additional Equipment:   Intra-op Plan:   Post-operative Plan:   Informed Consent: I have reviewed the patients History and Physical, chart, labs and discussed the procedure including the risks, benefits and alternatives for the proposed anesthesia with the patient or authorized representative who has indicated his/her understanding and acceptance.     Consent reviewed with POA  Plan Discussed with: CRNA  Anesthesia Plan Comments: (LMA/GETA backup discussed.  Patient, husband, and daughter at bedside consented for risks of anesthesia including but not limited to:  - adverse reactions to medications - damage to eyes, teeth, lips or other oral mucosa - nerve damage due to positioning  - sore throat or hoarseness - damage to heart, brain, nerves, lungs, other parts of body or loss of life  Informed patient and family about role of CRNA in peri- and intra-operative care; they voiced understanding.)        Anesthesia Quick Evaluation

## 2022-04-15 NOTE — Progress Notes (Signed)
[facility] ED [facility] Hospital Liaison note: ? ?This patient is currently enrolled in [facility] outpatient-based Palliative Care. Will continue to follow for disposition. ? ?Please call with any outpatient palliative questions or concerns. ? ?Thank you, ?Dee Layla Gramm, LPN ?[facility] Hospital Liaison ?[phone] ?

## 2022-04-16 ENCOUNTER — Encounter: Payer: Self-pay | Admitting: Internal Medicine

## 2022-04-16 DIAGNOSIS — K922 Gastrointestinal hemorrhage, unspecified: Secondary | ICD-10-CM | POA: Diagnosis not present

## 2022-04-16 LAB — CBC
HCT: 28 % — ABNORMAL LOW (ref 36.0–46.0)
Hemoglobin: 8.9 g/dL — ABNORMAL LOW (ref 12.0–15.0)
MCH: 29.9 pg (ref 26.0–34.0)
MCHC: 31.8 g/dL (ref 30.0–36.0)
MCV: 94 fL (ref 80.0–100.0)
Platelets: 271 10*3/uL (ref 150–400)
RBC: 2.98 MIL/uL — ABNORMAL LOW (ref 3.87–5.11)
RDW: 14.9 % (ref 11.5–15.5)
WBC: 6.7 10*3/uL (ref 4.0–10.5)
nRBC: 0 % (ref 0.0–0.2)

## 2022-04-16 LAB — BASIC METABOLIC PANEL
Anion gap: 8 (ref 5–15)
BUN: 13 mg/dL (ref 8–23)
CO2: 23 mmol/L (ref 22–32)
Calcium: 7.8 mg/dL — ABNORMAL LOW (ref 8.9–10.3)
Chloride: 102 mmol/L (ref 98–111)
Creatinine, Ser: 0.43 mg/dL — ABNORMAL LOW (ref 0.44–1.00)
GFR, Estimated: >60 mL/min (ref >60)
Glucose, Bld: 82 mg/dL (ref 70–99)
Potassium: 3.2 mmol/L — ABNORMAL LOW (ref 3.5–5.1)
Sodium: 133 mmol/L — ABNORMAL LOW (ref 135–145)

## 2022-04-16 MED ORDER — POTASSIUM CHLORIDE CRYS ER 20 MEQ PO TBCR: 40.0000 meq | EXTENDED_RELEASE_TABLET | Freq: Two times a day (BID) | ORAL | Status: DC

## 2022-04-16 MED ORDER — SODIUM CHLORIDE 0.9 % IV SOLN: INTRAVENOUS | Status: DC

## 2022-04-16 NOTE — NC FL2 (Signed)
[place] LEVEL OF CARE FORM     IDENTIFICATION  Patient Name: Kimberly Cantu Birthdate: [birth year] Sex: female Admission Date (Current Location): 04/14/2022  [place] and [place] Number:  Engineering geologist and Address:  [facility], [address] [place], [place] [address]      Provider Number: [id number]  Attending Physician Name and Address:  Richarda Osmond, MD  Relative Name and Phone Number:  Rinaldo Cloud- daughter- [phone]    Current Level of Care: Hospital Recommended Level of Care: Assisted Living Facility Prior Approval Number:    Date Approved/Denied:   PASRR Number:    Discharge Plan: Other (Comment) (ALF)    Current Diagnoses: Patient Active Problem List   Diagnosis Date Noted   Acute blood loss anemia 04/15/2022   Acute GI bleeding 04/15/2022   GI bleeding 80/99/8338   Chronic systolic CHF (congestive heart failure) ([place]) 04/14/2022   Acute upper GI bleeding 04/14/2022   Influenza A 03/28/2022   Acute lower UTI 25/08/3974   Acute metabolic encephalopathy 73/41/9379   Dementia without behavioral disturbance ([place]) 03/28/2022   Anxiety and depression 03/28/2022   Hypokalemia 03/28/2022   Hyponatremia 03/28/2022   Hospice care patient 04/21/2019   Elevated troponin level not due to acute coronary syndrome 04/20/2019   Goals of care, counseling/discussion 04/20/2019   Atrial fibrillation with RVR ([place]) 02/40/9735   Acute systolic CHF (congestive heart failure) ([place]) 04/19/2019   Mass of chest wall, right 04/19/2019   Acute respiratory distress 04/19/2019   Acute respiratory failure with hypoxia and hypercapnia (HCC) 04/19/2019   Bilateral pleural effusion 04/19/2019   Acute hypoxemic respiratory failure ([place]) 04/19/2019   Protein-calorie malnutrition, severe 04/19/2019   Severe anemia 04/18/2019    Orientation RESPIRATION BLADDER Height & Weight     Self  Normal Incontinent Weight: 48.5  kg Height:  '5\' 2"'$  (157.5 cm)  BEHAVIORAL SYMPTOMS/MOOD NEUROLOGICAL BOWEL NUTRITION STATUS      Incontinent Diet (Regular)  AMBULATORY STATUS COMMUNICATION OF NEEDS Skin   Limited Assist Verbally Normal                       Personal Care Assistance Level of Assistance  Bathing, Feeding, Dressing Bathing Assistance: Limited assistance Feeding assistance: Limited assistance Dressing Assistance: Limited assistance     Functional Limitations Info             [place]  PT (By licensed PT), OT (By licensed OT)     PT Frequency: home health OT Frequency: home health            Contractures Contractures Info: Not present    Additional Factors Info  Code Status, Allergies Code Status Info: Full Allergies Info: NKA           Current Medications (04/16/2022):  This is the current hospital active medication list Current Facility-Administered Medications  Medication Dose Route Frequency Provider Last Rate Last Admin   0.9 %  sodium chloride infusion   Intravenous Continuous Geanie Kenning, PA-C 20 mL/hr at 04/16/22 0601 New Bag at 04/16/22 0601   acetaminophen (TYLENOL) tablet 650 mg  650 mg Oral Q6H PRN Jani Gravel, MD       Or   acetaminophen (TYLENOL) suppository 650 mg  650 mg Rectal Q6H PRN Jani Gravel, MD       ondansetron [facility]) tablet 4 mg  4 mg Oral Q6H PRN Jani Gravel, MD       Or  ondansetron (ZOFRAN) injection 4 mg  4 mg Intravenous Q6H PRN Jani Gravel, MD       pantoprazole (PROTONIX) 80 mg /NS 100 mL IVPB  8 mg/hr Intravenous Continuous Jani Gravel, MD   Stopped at 04/15/22 0710   potassium chloride SA (KLOR-CON M) CR tablet 40 mEq  40 mEq Oral BID Richarda Osmond, MD       Current Outpatient Medications  Medication Sig Dispense Refill   CALMOSEPTINE 0.44-20.6 % OINT SMARTSIG:1 Gram(s) Topical Twice Daily PRN     Cholecalciferol (VITAMIN D-1000 MAX ST) 25 MCG (1000 UT) tablet Take 1,000 Units by mouth daily.      cyanocobalamin (VITAMIN B12) 1000 MCG tablet Take 1,000 mcg by mouth daily.     DEPAKOTE SPRINKLES 125 MG capsule Take 125 mg by mouth 2 (two) times daily.     donepezil (ARICEPT) 10 MG tablet Take 10 mg by mouth at bedtime.     ferrous sulfate 325 (65 FE) MG tablet Take 1 tablet (325 mg total) by mouth daily. 90 tablet 3   furosemide (LASIX) 40 MG tablet Take 1 tablet (40 mg total) by mouth daily. 30 tablet 0   ibandronate (BONIVA) 150 MG tablet Take 150 mg by mouth every 30 (thirty) days.     metoprolol succinate (TOPROL-XL) 25 MG 24 hr tablet Take 1 tablet (25 mg total) by mouth daily. 30 tablet 3   Multiple Vitamin (MULTIVITAMIN WITH MINERALS) TABS tablet Take 1 tablet by mouth daily. 30 tablet    nystatin (MYCOSTATIN/NYSTOP) powder Apply 1 Application topically 3 (three) times daily.     potassium chloride SA (KLOR-CON) 20 MEQ tablet Take 1 tablet (20 mEq total) by mouth daily. 30 tablet 0   sertraline (ZOLOFT) 100 MG tablet Take 100 mg by mouth daily.     sertraline (ZOLOFT) 25 MG tablet Take 25 mg by mouth daily.     ALPRAZolam (XANAX) 0.5 MG tablet Take 0.5 tablets (0.25 mg total) by mouth 3 (three) times daily as needed for up to 30 doses for anxiety or sleep. 30 tablet 0   polyethylene glycol (MIRALAX / GLYCOLAX) 17 g packet Take 17 g by mouth daily as needed for moderate constipation or severe constipation. 14 each 0     Discharge Medications: Medication List       STOP taking these medications     Erivedge 150 MG capsule Generic drug: vismodegib    senna-docusate 8.6-50 MG tablet Commonly known as: Senokot-S           TAKE these medications     ALPRAZolam 0.5 MG tablet Commonly known as: Xanax Take 0.5 tablets (0.25 mg total) by mouth 3 (three) times daily as needed for up to 30 doses for anxiety or sleep.    Calmoseptine 0.44-20.6 % Oint Generic drug: Menthol-Zinc Oxide SMARTSIG:1 Gram(s) Topical Twice Daily PRN    cyanocobalamin 1000 MCG tablet Commonly known  as: VITAMIN B12 Take 1,000 mcg by mouth daily.    Depakote Sprinkles 125 MG capsule Generic drug: divalproex Take 125 mg by mouth 2 (two) times daily.    donepezil 10 MG tablet Commonly known as: ARICEPT Take 10 mg by mouth at bedtime.    ferrous sulfate 325 (65 FE) MG tablet Take 1 tablet (325 mg total) by mouth daily.    furosemide 40 MG tablet Commonly known as: LASIX Take 1 tablet (40 mg total) by mouth daily.    ibandronate 150 MG tablet Commonly known as: BONIVA Take 150 mg  by mouth every 30 (thirty) days.    metoprolol succinate 25 MG 24 hr tablet Commonly known as: TOPROL-XL Take 1 tablet (25 mg total) by mouth daily.    multivitamin with minerals Tabs tablet Take 1 tablet by mouth daily.    nystatin powder Commonly known as: MYCOSTATIN/NYSTOP Apply 1 Application topically 3 (three) times daily.    polyethylene glycol 17 g packet Commonly known as: MIRALAX / GLYCOLAX Take 17 g by mouth daily as needed for moderate constipation or severe constipation.    potassium chloride SA 20 MEQ tablet Commonly known as: KLOR-CON M Take 1 tablet (20 mEq total) by mouth daily.    sertraline 25 MG tablet Commonly known as: ZOLOFT Take 25 mg by mouth daily.    sertraline 100 MG tablet Commonly known as: ZOLOFT Take 100 mg by mouth daily.    Vitamin D-1000 Max St 25 MCG (1000 UT) tablet Generic drug: Cholecalciferol Take 1,000 Units by mouth daily.        Relevant Imaging Results:  Relevant Lab Results:   Additional Information    Shelbie Hutching, RN

## 2022-04-16 NOTE — ED Notes (Signed)
Vitals obtained and were WNL, 2 IV's d/c'd. Pt dressed and placed in wheel chair. She was brought out to her daughters car. She will go back to [facility]

## 2022-04-16 NOTE — Progress Notes (Signed)
OT Cancellation Note  Patient Details Name: Kimberly Cantu MRN: [id number] DOB: Mar 12, 1946   Cancelled Treatment:    Reason Eval/Treat Not Completed: Fatigue/lethargy limiting ability to participate;Patient declined, no reason specified. OT orders received, chart reviewed. Pt becoming increasingly agitated with requests to complete OOB mobility in order to assess ADL function. Pt adamantly declining despite encouragement. Pt with cognitive deficits at baseline and confused t/o. Will defer post acute rehab needs to [facility]/ALF. Medical team made aware. Will complete orders.   Lanelle Bal  [facility] 04/16/2022, 1:52 PM

## 2022-04-16 NOTE — Anesthesia Postprocedure Evaluation (Signed)
Anesthesia Post Note  Patient: Kimberly Cantu  Procedure(s) Performed: ESOPHAGOGASTRODUODENOSCOPY (EGD) WITH PROPOFOL  Patient location during evaluation: PACU Anesthesia Type: General Level of consciousness: awake and alert, oriented and patient cooperative Pain management: pain level controlled Vital Signs Assessment: post-procedure vital signs reviewed and stable Respiratory status: spontaneous breathing, nonlabored ventilation and respiratory function stable Cardiovascular status: blood pressure returned to baseline and stable Postop Assessment: adequate PO intake Anesthetic complications: no   No notable events documented.   Last Vitals:  Vitals:   04/16/22 0109 04/16/22 0512  BP:    Pulse:    Resp:    Temp: 36.7 C 36.6 C  SpO2:      Last Pain:  Vitals:   04/16/22 0512  TempSrc: Oral  PainSc:                  Darrin Nipper

## 2022-04-16 NOTE — Discharge Summary (Signed)
Physician Discharge Summary  Patient: Kimberly Cantu SVX:[id number] DOB: Mar 17, 1946   Code Status: Full Code Admit date: 04/14/2022 Discharge date: 04/16/2022 Disposition: Assisted living, PT, OT, nurse aid, and RN PCP: Gladstone Lighter, MD  Recommendations for Outpatient Follow-up:  Follow up with PCP within 1-2 weeks Regarding general hospital follow up and preventative care Recommend repeat CBC for monitoring hgb recovery- 8.9 on day of discharge Follow up with hospice care  Discharge Diagnoses:  Principal Problem:   GI bleeding Active Problems:   Hyponatremia   Chronic systolic CHF (congestive heart failure) (HCC)   Acute upper GI bleeding   Acute blood loss anemia   Acute GI bleeding  Brief Hospital Course Summary: Kimberly Cantu is a 77 y.o. female with a PMH significant for dementia, Afib, CHF (systolic) and anemia in the past requiring admission, no prior colonoscopy or EGD.   They presented from ALF/ILF to the ED on 04/14/2022 with melena and coffee ground emesis x 1 days. Denies h/o NSAID use recently.    In the ED, it was found that they had T 97.9, P 128, Bp 138/119, pox 95% on RA .  Significant findings included Wbc 10.4, hgb 13.2, Plt 376   Na 131, K 4.0,  Bun 23, Creat 0.76, Ast 29, Alt 21, INR 1.4.   They were initially treated with IV protonix, supportive care. GI was consulted.    1/3- patient underwent EGD which revealed mechanical gastritis likely from retching/vomiting as the source of her bleeding. There was not active bleeding at time of procedure.   1/4- She had no more incidents of overt bleeding while inpatient. Her hgb was reduced but remained stable above transfusion threshold. Her diet was advanced and she was able to tolerate food without pain or vomiting. Her vital signs remained stable within normal limits. Per family request, she was evaluated by PT/OT/SLP prior to dc but is already receiving outpatient services.   All other chronic  conditions were treated with home medications.   Discharge Condition: Good, improved Recommended discharge diet: Regular healthy diet  Consultations: GI  Procedures/Studies: EGD 1/3  Allergies as of 04/16/2022   No Known Allergies      Medication List     STOP taking these medications    Erivedge 150 MG capsule Generic drug: vismodegib   senna-docusate 8.6-50 MG tablet Commonly known as: Senokot-S       TAKE these medications    ALPRAZolam 0.5 MG tablet Commonly known as: Xanax Take 0.5 tablets (0.25 mg total) by mouth 3 (three) times daily as needed for up to 30 doses for anxiety or sleep.   Calmoseptine 0.44-20.6 % Oint Generic drug: Menthol-Zinc Oxide SMARTSIG:1 Gram(s) Topical Twice Daily PRN   cyanocobalamin 1000 MCG tablet Commonly known as: VITAMIN B12 Take 1,000 mcg by mouth daily.   Depakote Sprinkles 125 MG capsule Generic drug: divalproex Take 125 mg by mouth 2 (two) times daily.   donepezil 10 MG tablet Commonly known as: ARICEPT Take 10 mg by mouth at bedtime.   ferrous sulfate 325 (65 FE) MG tablet Take 1 tablet (325 mg total) by mouth daily.   furosemide 40 MG tablet Commonly known as: LASIX Take 1 tablet (40 mg total) by mouth daily.   ibandronate 150 MG tablet Commonly known as: BONIVA Take 150 mg by mouth every 30 (thirty) days.   metoprolol succinate 25 MG 24 hr tablet Commonly known as: TOPROL-XL Take 1 tablet (25 mg total) by mouth daily.   multivitamin with  minerals Tabs tablet Take 1 tablet by mouth daily.   nystatin powder Commonly known as: MYCOSTATIN/NYSTOP Apply 1 Application topically 3 (three) times daily.   polyethylene glycol 17 g packet Commonly known as: MIRALAX / GLYCOLAX Take 17 g by mouth daily as needed for moderate constipation or severe constipation.   potassium chloride SA 20 MEQ tablet Commonly known as: KLOR-CON M Take 1 tablet (20 mEq total) by mouth daily.   sertraline 25 MG tablet Commonly  known as: ZOLOFT Take 25 mg by mouth daily.   sertraline 100 MG tablet Commonly known as: ZOLOFT Take 100 mg by mouth daily.   Vitamin D-1000 Max St 25 MCG (1000 UT) tablet Generic drug: Cholecalciferol Take 1,000 Units by mouth daily.       Subjective   Pt reports no abdominal or esophageal pain. She feels hungry and ready to eat. Denies nausea. She has not had any more episodes of bleeding that she is aware of.   Spoke with patient's daughter over the phone for update.   All questions and concerns were addressed at time of discharge.  Objective  Blood pressure 102/78, pulse 76, temperature 97.9 F (36.6 C), temperature source Oral, resp. rate 17, height '5\' 2"'$  (1.575 m), weight 48.5 kg, SpO2 99 %.   General: Pt is alert, awake, not in acute distress. Frail. Cardiovascular: RRR, S1/S2 +, no rubs, no gallops Respiratory: CTA bilaterally, no wheezing, no rhonchi Abdominal: Soft, NT, ND, bowel sounds + Extremities: no edema, no cyanosis Neuro: alert and oriented to self and location but not year. Follows commands but is clearly confused with her circumstances.   The results of significant diagnostics from this hospitalization (including imaging, microbiology, ancillary and laboratory) are listed below for reference.   Imaging studies: CT HEAD WO CONTRAST (5MM)  Result Date: 03/28/2022 CLINICAL DATA:  Mental status change, unknown cause EXAM: CT HEAD WITHOUT CONTRAST TECHNIQUE: Contiguous axial images were obtained from the base of the skull through the vertex without intravenous contrast. RADIATION DOSE REDUCTION: This exam was performed according to the departmental dose-optimization program which includes automated exposure control, adjustment of the mA and/or kV according to patient size and/or use of iterative reconstruction technique. COMPARISON:  None Available. FINDINGS: Brain: Patchy and confluent areas of decreased attenuation are noted throughout the deep and  periventricular white matter of the cerebral hemispheres bilaterally, compatible with chronic microvascular ischemic disease. No evidence of large-territorial acute infarction. No parenchymal hemorrhage. No mass lesion. No extra-axial collection. No mass effect or midline shift. No hydrocephalus. Basilar cisterns are patent. Vascular: No hyperdense vessel. Atherosclerotic calcifications are present within the cavernous internal carotid arteries. Skull: No acute fracture or focal lesion. Sinuses/Orbits: Right sphenoid, maxillary, bilateral ethmoid sinus mucosal thickening. Otherwise paranasal sinuses and mastoid air cells are clear. The orbits are unremarkable. Other: None. IMPRESSION: No acute intracranial abnormality. Electronically Signed   By: Iven Finn M.D.   On: 03/28/2022 20:09   CT Cervical Spine Wo Contrast  Result Date: 03/28/2022 CLINICAL DATA:  Neck pain x1 week EXAM: CT CERVICAL SPINE WITHOUT CONTRAST TECHNIQUE: Multidetector CT imaging of the cervical spine was performed without intravenous contrast. Multiplanar CT image reconstructions were also generated. RADIATION DOSE REDUCTION: This exam was performed according to the departmental dose-optimization program which includes automated exposure control, adjustment of the mA and/or kV according to patient size and/or use of iterative reconstruction technique. COMPARISON:  None Available. FINDINGS: Alignment: Normal cervical lordosis. Skull base and vertebrae: No acute fracture. No primary bone lesion  or focal pathologic process. Soft tissues and spinal canal: No prevertebral fluid or swelling. No visible canal hematoma. Disc levels: Mild degenerative changes at C5-6. Spinal canal is patent. Upper chest: Visualized lung apices are clear. Other: Visualized thyroid is unremarkable. IMPRESSION: Mild degenerative changes at C5-6.  Otherwise negative. Electronically Signed   By: Julian Hy M.D.   On: 03/28/2022 20:01   DG Chest 2  View  Result Date: 03/28/2022 CLINICAL DATA:  Altered mental status. Neck pain for 1 week. Patient tested positive for nfluenza EXAM: CHEST - 2 VIEW COMPARISON:  Chest radiograph 04/19/2019,CT angio chest 04/18/2019 FINDINGS: New mild elevation of the right hemidiaphragm. The heart size and mediastinal contours are within normal limits. No focal consolidation, pleural effusion, or pneumothorax. Diffuse osteopenia. Compression fracture of a midthoracic vertebral body with approximately 50% vertebral body height loss; this finding is new compared to prior CT chest 04/18/2019. Anterior wedging of several distal thoracic vertebral bodies. Old fractures of the posterolateral left seventh, eighth, and ninth ribs. IMPRESSION: 1. No acute cardiopulmonary abnormality. 2. Age-indeterminate compression fracture of a midthoracic vertebral body, new compared to most recent prior imaging on 04/18/2019. 3. New mild elevation of the right hemidiaphragm. Electronically Signed   By: Ileana Roup M.D.   On: 03/28/2022 14:20    Labs: Basic Metabolic Panel: Recent Labs  Lab 04/14/22 1235 04/15/22 0515 04/16/22 0530  NA 131* 136 133*  K 4.0 4.1 3.2*  CL 94* 102 102  CO2 20* 23 23  GLUCOSE 124* 70 82  BUN '23 23 13  '$ CREATININE 0.76 0.71 0.43*  CALCIUM 9.3 8.1* 7.8*   CBC: Recent Labs  Lab 04/14/22 1235 04/15/22 0515 04/15/22 1805 04/16/22 0530  WBC 10.4 8.1 8.6 6.7  HGB 13.2 9.8* 9.6* 8.9*  HCT 41.3 30.7* 30.1* 28.0*  MCV 94.3 93.6 94.1 94.0  PLT 376 326 305 271   Microbiology: Results for orders placed or performed during the hospital encounter of 03/28/22  Urine Culture     Status: None   Collection Time: 03/28/22  4:42 PM   Specimen: Urine, Clean Catch  Result Value Ref Range Status   Specimen Description   Final    URINE, CLEAN CATCH Performed at [facility], [address], [place], [place] [address]    Special Requests   Final    NONE Performed at [facility], [address], [place], [place] [address]    Culture   Final    NO GROWTH Performed at [facility], [place] [address], [place], [place] [address]    Report Status 03/30/2022 FINAL  Final  Resp panel by RT-PCR (RSV, Flu A&B, Covid) Anterior Nasal Swab     Status: None   Collection Time: 03/28/22  7:21 PM   Specimen: Anterior Nasal Swab  Result Value Ref Range Status   SARS Coronavirus 2 by RT PCR NEGATIVE NEGATIVE Final    Comment: (NOTE) SARS-CoV-2 target nucleic acids are NOT DETECTED.  The SARS-CoV-2 RNA is generally detectable in upper respiratory specimens during the acute phase of infection. The lowest concentration of SARS-CoV-2 viral copies this assay can detect is 138 copies/mL. A negative result does not preclude SARS-Cov-2 infection and should not be used as the sole basis for treatment or other patient management decisions. A negative result may occur with  improper specimen collection/handling, submission of specimen other than nasopharyngeal swab, presence of viral mutation(s) within the areas targeted by this assay, and inadequate number of viral copies(<138  copies/mL). A negative result must be combined with clinical observations, patient history, and epidemiological information. The expected result is Negative.  Fact Sheet for Patients:  EntrepreneurPulse.com.au  Fact Sheet for Healthcare Providers:  IncredibleEmployment.be  This test is no t yet approved or cleared by the [place] FDA and  has been authorized for detection and/or diagnosis of SARS-CoV-2 by FDA under an Emergency Use Authorization (EUA). This EUA will remain  in effect (meaning this test can be used) for the duration of the COVID-19 declaration under Section 564(b)(1) of the Act, 21 U.S.C.section 360bbb-3(b)(1), unless the authorization is terminated  or revoked sooner.       Influenza A by PCR NEGATIVE NEGATIVE Final   Influenza B by PCR NEGATIVE  NEGATIVE Final    Comment: (NOTE) The Xpert Xpress SARS-CoV-2/FLU/RSV plus assay is intended as an aid in the diagnosis of influenza from Nasopharyngeal swab specimens and should not be used as a sole basis for treatment. Nasal washings and aspirates are unacceptable for Xpert Xpress SARS-CoV-2/FLU/RSV testing.  Fact Sheet for Patients: EntrepreneurPulse.com.au  Fact Sheet for Healthcare Providers: IncredibleEmployment.be  This test is not yet approved or cleared by the [place] FDA and has been authorized for detection and/or diagnosis of SARS-CoV-2 by FDA under an Emergency Use Authorization (EUA). This EUA will remain in effect (meaning this test can be used) for the duration of the COVID-19 declaration under Section 564(b)(1) of the Act, 21 U.S.C. section 360bbb-3(b)(1), unless the authorization is terminated or revoked.     Resp Syncytial Virus by PCR NEGATIVE NEGATIVE Final    Comment: (NOTE) Fact Sheet for Patients: EntrepreneurPulse.com.au  Fact Sheet for Healthcare Providers: IncredibleEmployment.be  This test is not yet approved or cleared by the [place] FDA and has been authorized for detection and/or diagnosis of SARS-CoV-2 by FDA under an Emergency Use Authorization (EUA). This EUA will remain in effect (meaning this test can be used) for the duration of the COVID-19 declaration under Section 564(b)(1) of the Act, 21 U.S.C. section 360bbb-3(b)(1), unless the authorization is terminated or revoked.  Performed at [facility], [address], [place], [place] [address]    Time coordinating discharge: Over 30 minutes  Richarda Osmond, MD  [facility] 04/16/2022, 11:30 AM

## 2022-04-16 NOTE — ED Notes (Signed)
[facility]   CALLED  FOR TRANSPORT  TO  HOME  PLACE OF  Kimberly Cantu

## 2022-04-16 NOTE — TOC Initial Note (Signed)
Transition of Care [facility]) - Initial/Assessment Note    Patient Details  Name: Kimberly Cantu MRN: [id number] Date of Birth: July 21, 1945  Transition of [facility]) CM/SW Contact:    Shelbie Hutching, RN Phone Number: 04/16/2022, 12:10 PM  Clinical Narrative:                 Patient admitted to the hospital with GI bleeding.  Patient is hemodynamically stable and ready for discharge.  Patient is from [facility] and will return there today.  Daughter is aware of discharge and will be picking patient up and transporting her back to the facility.  RNCM called [facility] and they are aware of patient's return today.  FL2 and discharge summary sent with patient at DC.  [place] orders also sent with patient to be given to facility.    Expected Discharge Plan: Assisted Living Barriers to Discharge: Barriers Resolved   Patient Goals and CMS Choice Patient states their goals for this hospitalization and ongoing recovery are:: patient unable to state- family would like patient to return to [facility]          Expected Discharge Plan and Services   Discharge Planning Services: CM Consult   Living arrangements for the past 2 months: [place] Expected Discharge Date: 04/16/22               DME Arranged: N/A DME Agency: NA                  Prior Living Arrangements/Services Living arrangements for the past 2 months: [place] Lives with:: Facility Resident Patient language and need for interpreter reviewed:: Yes Do you feel safe going back to the place where you live?: Yes      Need for Family Participation in Patient Care: Yes (Comment) Care giver support system in place?: Yes (comment) Current home services: DME (walker, wheelchair) Criminal Activity/Legal Involvement Pertinent to Current Situation/Hospitalization: No - Comment as needed  Activities of Daily Living Home Assistive Devices/Equipment: Wheelchair, Environmental consultant (specify type) ADL  Screening (condition at time of admission) Patient's cognitive ability adequate to safely complete daily activities?: Yes Is the patient deaf or have difficulty hearing?: No Does the patient have difficulty seeing, even when wearing glasses/contacts?: No Does the patient have difficulty concentrating, remembering, or making decisions?: No Patient able to express need for assistance with ADLs?: Yes Does the patient have difficulty dressing or bathing?: Yes Independently performs ADLs?: No Communication: Independent Dressing (OT): Independent Grooming: Independent Feeding: Independent Bathing: Needs assistance Is this a change from baseline?: Pre-admission baseline Toileting: Needs assistance Is this a change from baseline?: Pre-admission baseline In/Out Bed: Needs assistance Is this a change from baseline?: Pre-admission baseline Walks in Home: Needs assistance Is this a change from baseline?: Pre-admission baseline Does the patient have difficulty walking or climbing stairs?: Yes Weakness of Legs: Both Weakness of Arms/Hands: None  Permission Sought/Granted Permission sought to share information with : Case Manager, Family Supports, Chartered certified accountant granted to share information with : Yes, Verbal Permission Granted  Share Information with NAME: Georjean Mode  Permission granted to share info w AGENCY: [facility]  Permission granted to share info w Relationship: daughter  Permission granted to share info w Contact Information: [phone]  Emotional Assessment Appearance:: Appears stated age Attitude/Demeanor/Rapport: Engaged Affect (typically observed): Pleasant Orientation: : Oriented to Self Alcohol / Substance Use: Not Applicable Psych Involvement: No (comment)  Admission diagnosis:  Acute upper GI bleeding [K92.2] Acute  GI bleeding [K92.2] Patient Active Problem List   Diagnosis Date Noted   Acute blood loss anemia 04/15/2022   Acute GI  bleeding 04/15/2022   GI bleeding 02/06/2535   Chronic systolic CHF (congestive heart failure) ([place]) 04/14/2022   Acute upper GI bleeding 04/14/2022   Influenza A 03/28/2022   Acute lower UTI 64/40/3474   Acute metabolic encephalopathy 25/95/6387   Dementia without behavioral disturbance ([place]) 03/28/2022   Anxiety and depression 03/28/2022   Hypokalemia 03/28/2022   Hyponatremia 03/28/2022   Hospice care patient 04/21/2019   Elevated troponin level not due to acute coronary syndrome 04/20/2019   Goals of care, counseling/discussion 04/20/2019   Atrial fibrillation with RVR ([place]) 56/43/3295   Acute systolic CHF (congestive heart failure) ([place]) 04/19/2019   Mass of chest wall, right 04/19/2019   Acute respiratory distress 04/19/2019   Acute respiratory failure with hypoxia and hypercapnia ([place]) 04/19/2019   Bilateral pleural effusion 04/19/2019   Acute hypoxemic respiratory failure ([place]) 04/19/2019   Protein-calorie malnutrition, severe 04/19/2019   Severe anemia 04/18/2019   PCP:  Gladstone Lighter, MD Pharmacy:   [id number] [place]: [phone]: [phone] [id number] [place] [address]: [phone]: [phone]   Social Determinants of Health (SDOH) Social History: SDOH Screenings   Food Insecurity: No Food Insecurity (04/15/2022)  Housing: Low Risk  (04/15/2022)  Transportation Needs: No Transportation Needs (04/15/2022)  Utilities: Not At Risk (04/15/2022)  Tobacco Use: Medium Risk (04/14/2022)   SDOH Interventions:     Readmission Risk Interventions     No data to display

## 2022-04-16 NOTE — Progress Notes (Signed)
PT Cancellation Note  Patient Details Name: Kimberly Cantu MRN: [id number] DOB: September 28, 1945   Cancelled Treatment:    Reason Eval/Treat Not Completed: Patient declined, no reason specified;Fatigue/lethargy limiting ability to participate (Pt engaged at bedside, is pleasant, but increasingly agitated with encouragement and assistance for OOB mobility. WIll defer post DC rehab needs to ACC/ALF. MD/TOC made aware.)  12:49 PM, 04/16/22 Etta Grandchild, PT, DPT Physical Therapist - [place] C 04/16/2022, 12:49 PM

## 2022-04-16 NOTE — Evaluation (Signed)
Clinical/Bedside Swallow Evaluation Patient Details  Name: Kimberly Cantu MRN: [id number] Date of Birth: 1945-12-31  Today's Date: 04/16/2022 Time: SLP Start Time (ACUTE ONLY): 69 SLP Stop Time (ACUTE ONLY): 1100 SLP Time Calculation (min) (ACUTE ONLY): 45 min  Past Medical History:  Past Medical History:  Diagnosis Date   Anemia    Atrial fibrillation ([place])    Basal cell carcinoma    CHF (congestive heart failure) (HCC)    EF 25%   Dementia (HCC)    Past Surgical History:  Past Surgical History:  Procedure Laterality Date   ESOPHAGOGASTRODUODENOSCOPY (EGD) WITH PROPOFOL N/A 04/15/2022   Procedure: ESOPHAGOGASTRODUODENOSCOPY (EGD) WITH PROPOFOL;  Surgeon: Toledo, Benay Pike, MD;  Location: [facility] ENDOSCOPY;  Service: Gastroenterology;  Laterality: N/A;   HIP PINNING Left    HPI:  Pt is a 77 y.o. female with a PMH significant for Dementia, Afib, CHF (systolic) and anemia in the past requiring admission, no prior colonoscopy or EGD.     They presented from ALF/ILF to the ED on 04/14/2022 with melena and coffee ground emesis x 1 days.  GI has followed w/ an EGD: "Mild retching injury "mechanical gastritis" in the fundus and body of the stomach; hiatal hernia".  Pt was just recently discharged from the hospital 03/28/2022 w/ FTT and weakness.  Pt may benefit from a Palliative Care consult to discuss [place] in general.    Assessment / Plan / Recommendation  Clinical Impression   Pt seen for BSE today. Pt awake, resting in bed after being cleaned up by [facility] staff. Verbal and Tangential; baseline Dementia. Husband present.  On RA; afebrile, WBC wnl.  Pt appears to present w/ functional oropharyngeal phase swallowing in setting of declined Cognitive status; Baseline Dementia. Recent admit to this ED revealed concern for FTT at that admit. ANY Cognitive decline can impact her overall awareness/timing of swallow and safety during po tasks which increases risk for aspiration, choking as well as  potential decreased oral intake overall. Pt's risk for aspiration can be reduced when following general aspiration precautions and using a slightly modified diet consistency of broken down, moistened foods for ease of eating and Esophageal clearing -- hiatal hernia found on EGD this admit.  Pt required MOD verbal/visual cues for follow through during po tasks and self-feeding.        Pt consumed several trials of ice chips, purees, cut-small solids and thin liquids via cup/straw w/ No overt clinical s/s of aspiration noted: no decline in vocal quality; no cough, and no decline in respiratory status during/post trials. O2 sats remained in upper 90s. Oral phase was adequate for bolus management and oral clearing of the boluses given. Mastication of softened solids adequate given Time and moistening the foods broken small. Pt was able to self-feed w/ setup support and guidance d/t the Cognitive decline. Feeding self improves safety of swallowing.  OM Exam appeared [facility] w/ No unilateral weakness noted. Some confusion of OM tasks noted.          In setting of baseline Dementia and Cognitive decline, and current illness/hospitalization, recommend a more mech soft/regular diet cut food moistened for ease of eating and Esophageal clearing; thin liquids w/ general aspiration precautions; reduce Distractions during meals and engage pt during meals for self-feeding. Pills Crushed vs Whole in Puree for safer swallowing d/t Dementia. Support at meals as needed. MD/NSG updated.   ST services recommends follow w/ Palliative Care for [place] and education re: impact of Cognitive decline/Dementia on swallowing and oral  intake overall. Suspect pt is close to/at her baseline. Precautions discussed w/ Husband present who agreed.  SLP Visit Diagnosis: Dysphagia, unspecified (R13.10) (baseline Dementia; Esophageal phase issues)    Aspiration Risk   (reduced following general aspiration precautions)    Diet Recommendation   a  more mech soft/regular diet cut food moistened for ease of eating and Esophageal clearing; thin liquids w/ general aspiration precautions; reduce Distractions during meals and engage pt during meals for self-feeding. Support at meals as needed.  Medication Administration: Crushed with puree (as needed to for safer swallowing d/t Dementia)    Other  Recommendations Recommended Consults:  (Palliative Care for [place]) Oral Care Recommendations: Oral care BID;Oral care before and after PO;Staff/trained caregiver to provide oral care (support) Other Recommendations:  (n/a)    Recommendations for follow up therapy are one component of a multi-disciplinary discharge planning process, led by the attending physician.  Recommendations may be updated based on patient status, additional functional criteria and insurance authorization.  Follow up Recommendations No SLP follow up      Assistance Recommended at Discharge  full  Functional Status Assessment Patient has had a recent decline in their functional status and demonstrates the ability to make significant improvements in function in a reasonable and predictable amount of time.  Frequency and Duration  (n/a)   (n/a)       Prognosis Prognosis for Safe Diet Advancement: Fair (-Good) Barriers to Reach Goals: Cognitive deficits;Time post onset;Severity of deficits;Behavior Barriers/Prognosis Comment: Dementia; Esophageal phase issues      Swallow Study   General Date of Onset: 04/14/22 HPI: Pt is a 78 y.o. female with a PMH significant for Dementia, Afib, CHF (systolic) and anemia in the past requiring admission, no prior colonoscopy or EGD.     They presented from ALF/ILF to the ED on 04/14/2022 with melena and coffee ground emesis x 1 days.  GI has followed w/ an EGD: "Mild retching injury "mechanical gastritis" in the fundus and body of the stomach; hiatal hernia".  Pt was just recently discharged from the hospital 03/28/2022 w/ FTT and weakness.  Pt  may benefit from a Palliative Care consult to discuss [place] in general. Type of Study: Bedside Swallow Evaluation Previous Swallow Assessment: none Diet Prior to this Study: Regular;Thin liquids (per MD) Temperature Spikes Noted: No (wbc 6.7) Respiratory Status: Room air History of Recent Intubation: No Behavior/Cognition: Alert;Cooperative;Pleasant mood;Confused;Distractible;Requires cueing;Doesn't follow directions Oral Cavity Assessment: Dry Oral Care Completed by SLP: Recent completion by staff Oral Cavity - Dentition: Adequate natural dentition;Missing dentition (few) Vision: Functional for self-feeding Self-Feeding Abilities: Able to feed self;Needs assist;Needs set up Patient Positioning: Upright in bed (needed positioning) Baseline Vocal Quality: Normal Volitional Cough: Strong Volitional Swallow: Able to elicit    Oral/Motor/Sensory Function Overall Oral Motor/Sensory Function: Within functional limits (no unilateral weakness)   Ice Chips Ice chips: Within functional limits Presentation: Spoon (fed; 2 trials)   Thin Liquid Thin Liquid: Within functional limits Presentation: Cup;Self Fed;Straw (10+ trials)    Nectar Thick Nectar Thick Liquid: Not tested   Honey Thick Honey Thick Liquid: Not tested   Puree Puree: Within functional limits Presentation: Self Fed;Spoon (~3+ ozs)   Solid     Solid: Within functional limits (grossly) Presentation: Self Fed;Spoon (3 trials) Other Comments: graham crackers moistened in puree        Orinda Kenner, MS, CCC-SLP Speech Language Pathologist Rehab Services; [place] [phone] (ascom) Osie Merkin 04/16/2022,2:42 PM

## 2022-04-16 NOTE — Progress Notes (Incomplete)
PROGRESS NOTE  Kimberly Cantu    DOB: November 05, 1945, 77 y.o.  OQH:[id number]    Code Status: Full Code   DOA: 04/14/2022   LOS: 2   Brief hospital course  Kimberly Cantu is a 77 y.o. female with a PMH significant for dementia, Afib, CHF (systolic) and anemia in the past requiring admission, no prior colonoscopy or EGD.  They presented from ALF/ILF to the ED on 04/14/2022 with melena and coffee ground emesis x 1 days. Denies h/o NSAID use recently.   In the ED, it was found that they had T 97.9, P 128, Bp 138/119, pox 95% on RA .  Significant findings included Wbc 10.4, hgfb 13.2, Plt 376   Na 131, K 4.0,  Bun 23, Creat 0.76, Ast 29, Alt 21, INR 1.4.  They were initially treated with IV protonix, supportive care. GI was consulted.   Patient was admitted to medicine service for further workup and management of GI bleed as outlined in detail below.  04/16/22 -stable  Assessment & Plan  Principal Problem:   GI bleeding Active Problems:   Hyponatremia   Chronic systolic CHF (congestive heart failure) (HCC)   Acute upper GI bleeding   Acute blood loss anemia   Acute GI bleeding  GI bleed- no new occurrence this am. Hgb greatly reduced since presentation. Hgb 13.2>9.8. transfusion threshold <7.0. hemodynamically stable - GI following, appreciate recs  - EGD, colonoscopy today  - mechanical gastritis, hiatal hernia - follow CBC  HFrEF- echo 04/2019 shows EF 25-30% with global LV hypokinesis  - restart home medications once able to tolerate PO  - metoprolol, lasix  Afib- not on anticoagulation at baseline. Currently rate controlled without medication - restart metoprolol when able  H/o osteoporosis  - hold home boniva  Dementia without disturbance  - continue home meds when able to take PO  Body mass index is 19.56 kg/m.  VTE ppx: SCDs Start: 04/15/22 0254  Diet:     Diet   Diet NPO time specified   Consultants: GI  Subjective 04/16/22    Pt reports no  complaints. Sedated after procedure.   Objective   Vitals:   04/15/22 1356 04/15/22 1957 04/16/22 0109 04/16/22 0512  BP: 97/66 102/78    Pulse: 80 76    Resp: 16 17    Temp:  97.7 F (36.5 C) 98 F (36.7 C) 97.9 F (36.6 C)  TempSrc:  Oral Oral Oral  SpO2: 100% 99%    Weight:      Height:       No intake or output data in the 24 hours ending 04/16/22 0736  Filed Weights   04/14/22 1234 04/14/22 1245  Weight: 48.5 kg 48.5 kg     Physical Exam:  General: awake, alert, NAD HEENT: atraumatic, clear conjunctiva, anicteric sclera, MMM Respiratory: normal respiratory effort. Cardiovascular: quick capillary refill Gastrointestinal: soft, NT, ND Nervous: A&O x1. no gross focal neurologic deficits Extremities: moves all equally, no edema, normal tone Skin: dry, intact, normal temperature, normal color. No rashes, lesions or ulcers on exposed skin  Labs   I have personally reviewed the following labs and imaging studies CBC    Component Value Date/Time   WBC 6.7 04/16/2022 0530   RBC 2.98 (L) 04/16/2022 0530   HGB 8.9 (L) 04/16/2022 0530   HCT 28.0 (L) 04/16/2022 0530   PLT 271 04/16/2022 0530   MCV 94.0 04/16/2022 0530   MCH 29.9 04/16/2022 0530   MCHC 31.8 04/16/2022 0530  RDW 14.9 04/16/2022 0530   LYMPHSABS 0.8 03/28/2022 1314   MONOABS 1.4 (H) 03/28/2022 1314   EOSABS 0.0 03/28/2022 1314   BASOSABS 0.0 03/28/2022 1314      Latest Ref Rng & Units 04/16/2022    5:30 AM 04/15/2022    5:15 AM 04/14/2022   12:35 PM  BMP  Glucose 70 - 99 mg/dL 82  70  124   BUN 8 - 23 mg/dL '13  23  23   '$ Creatinine 0.44 - 1.00 mg/dL 0.43  0.71  0.76   Sodium 135 - 145 mmol/L 133  136  131   Potassium 3.5 - 5.1 mmol/L 3.2  4.1  4.0   Chloride 98 - 111 mmol/L 102  102  94   CO2 22 - 32 mmol/L '23  23  20   '$ Calcium 8.9 - 10.3 mg/dL 7.8  8.1  9.3     Disposition Plan & Communication  Patient status: Inpatient  Admitted From: Home Planned disposition location: Home Anticipated  discharge date: 1/6 pending stabilization  Family Communication: none at bedside    Author: Richarda Osmond, DO [facility] 04/16/2022, 7:36 AM   Available by Epic secure chat 7AM-7PM. If 7PM-7AM, please contact night-coverage.  [facility] contact information found on [email or web address].

## 2022-05-31 ENCOUNTER — Non-Acute Institutional Stay: Payer: Medicare Other | Admitting: Nurse Practitioner

## 2022-05-31 ENCOUNTER — Encounter: Payer: Self-pay | Admitting: Nurse Practitioner

## 2022-05-31 DIAGNOSIS — R5381 Other malaise: Secondary | ICD-10-CM

## 2022-05-31 DIAGNOSIS — Z515 Encounter for palliative care: Secondary | ICD-10-CM

## 2022-05-31 DIAGNOSIS — F0393 Unspecified dementia, unspecified severity, with mood disturbance: Secondary | ICD-10-CM

## 2022-05-31 NOTE — Progress Notes (Signed)
[facility] Consult Note Telephone: [phone]  Fax: [phone]    Date of encounter: 05/31/22 5:01 PM PATIENT NAME: Kimberly Cantu Kimberly Cantu [id number]   [phone] (home)  DOB: December 10, 1945 MRN: [id number] PRIMARY CARE PROVIDER:    [facility]  RESPONSIBLE PARTY:    Contact Information     Name Relation Home Work Kimberly Cantu Daughter   [id number]   katlin, godbout [phone]  [phone]      I met face to face with patient in facility. Palliative Care was asked to follow this patient by consultation request of  [facility] to address advance care planning and complex medical decision making. This is a follow up visit.                                  ASSESSMENT AND PLAN / RECOMMENDATIONS:  Symptom Management/Plan: 1. Advance Care Planning;  DNR 2. Goals of Care: Goals include to maximize quality of life and symptom management. Our advance care planning conversation included a discussion about:    The value and importance of advance care planning  Exploration of personal, cultural or spiritual beliefs that might influence medical decisions  Exploration of goals of care in the event of a sudden injury or illness  Identification and preparation of a healthcare agent  Review and updating or creation of an advance directive document. 3. Palliative care encounter; Palliative care encounter; Palliative medicine team will continue to support patient, patient's family, and medical team. Visit consisted of counseling and education dealing with the complex and emotionally intense issues of symptom management and palliative care in the setting of serious and potentially life-threatening illness  4. Dementia with anxiety; progression of dementia, mood swings including today's visit, Psych NP will address increase in moods, agitation  today. Discussed with staff re-direction, ongoing discussion of overall decline, debility.  02/15/2022 weight 107 lbs (Cardiology)  04/14/2022 weight 107 lbs (ED weight)  5. Debility secondary to Dementia, progressive, uses walker though appears unstable with ambulation. Fall precaution, continue to encourage safety, monitoring ambulation Follow up Palliative Care Visit: PC f/u visit further discussion monitor trends of appetite, weights, monitor for functional, cognitive decline with chronic disease progression, assess any active symptoms, supportive role. Palliative care will continue to follow for complex medical decision making, advance care planning, and clarification of goals. Return 4 to 8 weeks or prn.  I spent 41 minutes providing this consultation starting at 11:45 am. More than 50% of the time in this consultation was spent in counseling and care coordination. PPS: 50%  Chief Complaint: Follow up palliative consult for complex medical decision making, address goals, manage ongoing symptoms  HISTORY OF PRESENT ILLNESS:  Kimberly Cantu is a 77 y.o. year old female  with multiple medical problems including dementia, depression, anxiety, basal cell carcinoma, paroxysmal atrial fibrillation, CHF, dilated cardiomyopathy, chronic anemia, protein calorie malnutrition, OA. Kimberly. Kimberly Cantu resides at [facility]. Kimberly Cantu requires walker for ambulation, staff endorses unsteady gait, requires assistance. Kimberly Cantu mood, agitation increasing this week. ED visit 03/28/2022 for torticollis, influenza A, prescribed tamiflu with amoxicillin 3 days prior by primary. ED visit 04/14/2022 to 04/16/2022 for GI bleed, vomiting with black tarry stools with endoscopy. Kimberly Cantu was stabilized and d/c back to [facility] where she resides. Staff endorses requires assistance for ADL's, tray setup. Appetite  has been declined. Purpose of today PC f/u visit further discussion monitor trends of appetite, weights,  monitor for functional, cognitive decline with chronic disease progression, assess any active symptoms, supportive role.Kimberly Cantu at present is ambulating in the hall, unsteady. Kimberly Cantu did stop to sit on a chair for a few minutes, brief interaction as she replied "I am fine". "I don't need anything". "2 people already listened to my heart today, ask them". "I don't want to talk about anything". "I want to go". Attempted to provide support. Will contact dtg for further discussions of goc. Medications, goc, poc reviewed. No recommendations today or changes to poc.   History obtained from review of EMR, discussion with primary team, and interview with family, facility staff/caregiver and/or Kimberly. Cantu.  I reviewed available labs, medications, imaging, studies and related documents from the EMR.  Records reviewed and summarized above.   Physical Exam: Constitutional: NAD General: frail appearing, thin, agitated female ENMT: oral mucous membranes moist MSK: unsteady gait with walker observed Skin: warm and dry Neuro:  + generalized weakness,  + cognitive impairment Psych: non-anxious affect, A and Oriented to self, agitated Thank you for the opportunity to participate in the care of Kimberly Cantu. Please call our office at [phone] if we can be of additional assistance.   Kimberly Telleria Ihor Gully, NP

## 2022-06-02 ENCOUNTER — Telehealth: Payer: Self-pay | Admitting: Nurse Practitioner

## 2022-06-02 NOTE — Telephone Encounter (Signed)
I called Kimberly Mode, Ms [facility] dtg, clinical update discussed, talked about pc visit. We talked about moods, therapy, labs will be done every 2 weeks for monitoring anemia. We talked about role pc, she does have a Actuary. We talked about poc, goc.   Total time 15 minutes Documentation 5 minutes Phone discussion 10 minutes

## 2022-06-10 ENCOUNTER — Non-Acute Institutional Stay: Payer: Medicare Other | Admitting: Nurse Practitioner

## 2022-06-10 DIAGNOSIS — Z515 Encounter for palliative care: Secondary | ICD-10-CM

## 2022-06-10 DIAGNOSIS — F0393 Unspecified dementia, unspecified severity, with mood disturbance: Secondary | ICD-10-CM

## 2022-06-10 NOTE — Progress Notes (Addendum)
[facility] Consult Note Telephone: [phone]  Fax: [phone]    Date of encounter: 06/10/22 4:46 PM PATIENT NAME: Kimberly Cantu [address] [place] [address]   [phone] (home)  DOB: 10-May-1945 MRN: [id number] PRIMARY CARE PROVIDER:    [facility]  RESPONSIBLE PARTY:    Contact Information     Name Relation Home Work [place] Daughter   [id number]   shane, velic [phone]  [phone]      I met face to face with patient in facility. Palliative Care was asked to follow this patient by consultation request of  [facility] to address advance care planning and complex medical decision making. This is a follow up visit.                                  ASSESSMENT AND PLAN / RECOMMENDATIONS:  Symptom Management/Plan: 1. Advance Care Planning;  DNR 2. Goals of Care: Goals include to maximize quality of life and symptom management. Our advance care planning conversation included a discussion about:    The value and importance of advance care planning  Exploration of personal, cultural or spiritual beliefs that might influence medical decisions  Exploration of goals of care in the event of a sudden injury or illness  Identification and preparation of a healthcare agent  Review and updating or creation of an advance directive document. 3. Palliative care encounter; Palliative care encounter; Palliative medicine team will continue to support patient, patient's family, and medical team. Visit consisted of counseling and education dealing with the complex and emotionally intense issues of symptom management and palliative care in the setting of serious and potentially life-threatening illness   4. Dementia with anxiety; progression of dementia, mood swings including today's visit, Psych NP will address increase in moods, agitation today.  Discussed with staff re-direction, ongoing discussion of overall decline, debility.  02/15/2022 weight 107 lbs (Cardiology)  04/14/2022 weight 107 lbs (ED weight)  05/13/2022 weight 105 lbs 5. Debility secondary to Dementia, progressive, uses walker though appears unstable with ambulation. Fall precaution, continue to encourage safety, monitoring ambulation Follow up Palliative Care Visit: PC f/u visit further discussion monitor trends of appetite, weights, monitor for functional, cognitive decline with chronic disease progression, assess any active symptoms, supportive role. Palliative care will continue to follow for complex medical decision making, advance care planning, and clarification of goals. Return 4 to 8 weeks or prn.   I spent 44 minutes providing this consultation. More than 50% of the time in this consultation was spent in counseling and care coordination. PPS: 50%   Chief Complaint: Follow up palliative consult for complex medical decision making, address goals, manage ongoing symptoms   HISTORY OF PRESENT ILLNESS:  Kimberly Cantu is a 77 y.o. year old female  with multiple medical problems including dementia, depression, anxiety, basal cell carcinoma, paroxysmal atrial fibrillation, CHF, dilated cardiomyopathy, chronic anemia, protein calorie malnutrition, OA. Kimberly Cantu resides at [facility]. Kimberly Cantu requires walker for ambulation, staff endorses unsteady gait, requires assistance. Kimberly Cantu mood, agitation increasing this week. ED visit 03/28/2022 for torticollis, influenza A, prescribed tamiflu with amoxicillin 3 days prior by primary. ED visit 04/14/2022 to 04/16/2022 for GI bleed, vomiting with black tarry stools with endoscopy. Kimberly Cantu was stabilized and d/c back to [facility]. Staff endorses requires assistance for ADL's,  tray setup. Appetite has been declined. Purpose of today PC f/u visit further discussion monitor trends of appetite, weights,  monitor for functional, cognitive decline with chronic disease progression, assess any active symptoms, supportive role.Kimberly Cantu at present is sitting in her room, smiling, pleasant, engaging, interactive. Kimberly Cantu apologized for last pc visit with her behavior. Kimberly Cantu endorses "I know when I behave badly". Kimberly Cantu talked about how she has been feeling better now. Kimberly Cantu was interactive, engaging, talked about ros, functional abilities, appetite, weights, nutrition. We talked about daily routine, quality of life. We talked about residing at facility. Supportive visit, currently stable.   [id number] I called Kimberly Cantu, Kimberly Cantu daughter, clinical update discussed. Talked about pc visit. We talked about overall chronic disease progression, symptoms, appetite, weights. We talked about behaviors, quality of life. We talked about f/u pc vists. Support provided.   History obtained from review of EMR, discussion with primary team, and interview with family, facility staff/caregiver and/or Kimberly. Cantu.  I reviewed available labs, medications, imaging, studies and related documents from the EMR.  Records reviewed and summarized above.    Physical Exam: Constitutional: NAD General: frail appearing, thin, pleasant female ENMT: oral mucous membranes moist MSK: unsteady gait with walker observed Skin: warm and dry Neuro:  + generalized weakness,  + cognitive impairment Psych: non-anxious affect, A and Oriented  Thank you for the opportunity to participate in the care of Kimberly Cantu. Please call our office at [phone] if we can be of additional assistance.   [place] Ihor Gully, NP

## 2022-07-16 ENCOUNTER — Non-Acute Institutional Stay: Payer: Medicare Other | Admitting: Nurse Practitioner

## 2022-07-16 VITALS — BP 120/71 | HR 70 | Resp 18 | Wt 92.8 lb

## 2022-07-16 DIAGNOSIS — F0393 Unspecified dementia, unspecified severity, with mood disturbance: Secondary | ICD-10-CM

## 2022-07-16 DIAGNOSIS — Z515 Encounter for palliative care: Secondary | ICD-10-CM

## 2022-07-16 DIAGNOSIS — F32A Anxiety disorder, unspecified: Secondary | ICD-10-CM

## 2022-07-16 DIAGNOSIS — R5381 Other malaise: Secondary | ICD-10-CM

## 2022-07-16 NOTE — Progress Notes (Signed)
[facility] Consult Note Telephone: [phone]  Fax: [phone]    Date of encounter: 07/16/22 3:04 PM PATIENT NAME: Kimberly DoyneSandra Cantu [address] [place] [id number]   [phone] (home)  DOB: Mar 08, 1946 MRN: [id number] PRIMARY CARE PROVIDER:    [facility]  RESPONSIBLE PARTY:    Contact Information     Name Relation Home Work Mobile   Kimberly Cantu,Kimberly Cantu Daughter   [phone]   Kimberly Cantu,Kimberly Cantu Spouse [phone]  [phone]     I met face to face with patient in facility. Palliative Care was asked to follow this patient by consultation request of  [facility] to address advance care planning and complex medical decision making. This is a follow up visit.                                  ASSESSMENT AND PLAN / RECOMMENDATIONS:  Symptom Management/Plan: 1. Advance Care Planning;  DNR 2. Palliative care encounter; Palliative care encounter; Palliative medicine team will continue to support patient, patient's family, and medical team. Visit consisted of counseling and education dealing with the complex and emotionally intense issues of symptom management and palliative care in the setting of serious and potentially life-threatening illness   3. Dementia with anxiety and agitation; progression of dementia, mood swings including today's visit, Discussed with staff re-direction, ongoing discussion of overall decline, debility. Discussed with Kimberly Musterlicia daughter about medications, will re-visit with Psych NP to see options, reviewed medications. We talked about moods, medications  02/15/2022 weight 107 lbs (Cardiology)  04/14/2022 weight 107 lbs (ED weight) 05/13/2022 weight 105 lbs 06/11/2022 weight 92.8 lbs 14.2 lbs/3 months; 13.17 % 5. Debility secondary to Dementia, progressive, uses walker though appears unstable with ambulation. Fall precaution, continue to encourage safety, monitoring  ambulation Follow up Palliative Care Visit: PC f/u visit further discussion monitor trends of appetite, weights, monitor for functional, cognitive decline with chronic disease progression, assess any active symptoms, supportive role. Palliative care will continue to follow for complex medical decision making, advance care planning, and clarification of goals. Return 4 to 8 weeks or prn.   I spent 42 minutes providing this consultation starting at 9:45 am. More than 50% of the time in this consultation was spent in counseling and care coordination. PPS: 50%   Chief Complaint: Follow up palliative consult for complex medical decision making, address goals, manage ongoing symptoms   HISTORY OF PRESENT ILLNESS:  Kimberly DoyneSandra Cantu is a 77 y.o. year old female  with multiple medical problems including dementia, depression, anxiety, basal cell carcinoma, paroxysmal atrial fibrillation, CHF, dilated cardiomyopathy, chronic anemia, protein calorie malnutrition, OA. Kimberly. Kimberly Cantu [facility]. Kimberly Cantu requires walker for ambulation, staff endorses unsteady gait, requires assistance. Kimberly Cantu was stabilized and d/c back to [facility] where she resides. Staff endorses requires assistance for ADL's, tray setup. Appetite has been declined. Purpose of today PC f/u visit further discussion monitor trends of appetite, weights, monitor for functional, cognitive decline with chronic disease progression, assess any active symptoms, supportive role. Kimberly Cantu currently is coming out of her room, ambulating with her walker. Kimberly Cantu sat on the seat of her walker to have visit. Discussed ros, functional abilities. Kimberly Cantu endorses she has good and bad days. Kimberly Cantu endorses today is a bad day, when asked to elaborate, she shared that she is frustrated about having intermit diarrhea, difficulty with daily  interactions, at times she feels tremors mostly in the morning. We talked about what  brings her joy, daily routine, what is challenging for her. We talked about what brings her frustration. We talked about debility. We talked about coping strategies. Kimberly Cantu endorses she was thankful for pc visit, and looks forward to next pc visit. I contacted her daughter Kimberly Cantu, clinical update discussed, talked about pc visit, moods, tremors, reviewed medications. We talked about quality of life. Discussed concerns. Support provided. Will continue to follow PC.    History obtained from review of EMR, discussion with primary team, and interview with family, facility staff/caregiver and/or Kimberly. Cantu.  I reviewed available labs, medications, imaging, studies and related documents from the EMR.  Records reviewed and summarized above.    Physical Exam: General: frail appearing, thin, debilitated female ENMT: oral mucous membranes moist MSK: unsteady gait with walker observed Skin: warm and dry Neuro:  + generalized weakness,  + cognitive impairment Psych: non-anxious affect, A and Oriented Thank you for the opportunity to participate in the care of Kimberly Cantu. Please call our office at [phone] if we can be of additional assistance.   Courtenay Hirth Prince Rome, NP

## 2022-08-11 ENCOUNTER — Non-Acute Institutional Stay: Payer: Medicare Other | Admitting: Nurse Practitioner

## 2022-08-11 DIAGNOSIS — F0393 Unspecified dementia, unspecified severity, with mood disturbance: Secondary | ICD-10-CM

## 2022-08-11 DIAGNOSIS — F32A Depression, unspecified: Secondary | ICD-10-CM

## 2022-08-11 DIAGNOSIS — R5381 Other malaise: Secondary | ICD-10-CM

## 2022-08-11 DIAGNOSIS — Z515 Encounter for palliative care: Secondary | ICD-10-CM

## 2022-08-11 DIAGNOSIS — R630 Anorexia: Secondary | ICD-10-CM

## 2022-08-11 NOTE — Progress Notes (Signed)
Therapist, nutritional Palliative Care Consult Note Telephone: [phone]  Fax: [phone]    Date of encounter: 08/11/22 3:51 PM PATIENT NAME: Kimberly Cantu [address] [place] [address]   [phone] (home)  DOB: 15-Dec-1945 MRN: [id number] PRIMARY CARE PROVIDER:    [facility]  RESPONSIBLE PARTY:    Contact Information     Name Relation Home Work Mobile   [place] Daughter   [phone]   jazsmin, couse [phone]  [phone]      I met face to face with patient in facility. Palliative Care was asked to follow this patient by consultation request of  [facility] to address advance care planning and complex medical decision making. This is a follow up visit.                                  ASSESSMENT AND PLAN / RECOMMENDATIONS:  Symptom Management/Plan: 1. Advance Care Planning;  DNR 2. Palliative care encounter; Palliative care encounter; Palliative medicine team will continue to support patient, patient's family, and medical team. Visit consisted of counseling and education dealing with the complex and emotionally intense issues of symptom management and palliative care in the setting of serious and potentially life-threatening illness   3. Dementia with anxiety and agitation; progression of dementia, mood swings including today's visit, Discussed with staff re-direction, ongoing discussion of overall decline, debility. Discussed with Kimberly Cantu daughter about medications, will re-visit with Psych NP to see options, reviewed medications. We talked about moods, medications  02/15/2022 weight 107 lbs (Cardiology)  04/14/2022 weight 107 lbs (ED weight) 05/13/2022 weight 105 lbs 06/11/2022 weight 92.8 lbs 14.2 lbs/3 months; 13.17 %  5. Debility secondary to Dementia, progressive, uses walker though appears unstable with ambulation. Fall precaution, continue to encourage  safety, monitoring ambulation Follow up Palliative Care Visit: PC f/u visit further discussion monitor trends of appetite, weights, monitor for functional, cognitive decline with chronic disease progression, assess any active symptoms, supportive role. Palliative care will continue to follow for complex medical decision making, advance care planning, and clarification of goals. Return 2 to 8 weeks or prn.   I spent 45 minutes providing this consultation. More than 50% of the time in this consultation was spent in counseling and care coordination. PPS: 50%   Chief Complaint: Follow up palliative consult for complex medical decision making, address goals, manage ongoing symptoms   HISTORY OF PRESENT ILLNESS:  Kimberly Cantu is a 77 y.o. year old female  with multiple medical problems including dementia, depression, anxiety, basal cell carcinoma, paroxysmal atrial fibrillation, CHF, dilated cardiomyopathy, chronic anemia, protein calorie malnutrition, OA. Kimberly Cantu resides at [facility]. Kimberly Cantu requires walker for ambulation, staff endorses unsteady gait, requires assistance. Kimberly Cantu was stabilized and d/c back to [facility] where she resides. Staff endorses requires assistance for ADL's, tray setup. Appetite has been declined. Purpose of today PC f/u visit further discussion monitor trends of appetite, weights, monitor for functional, cognitive decline with chronic disease progression, assess any active symptoms, supportive role. Kimberly Cantu currently is ambulating back to her room where her husband is waiting for her.    History obtained from review of EMR, discussion with primary team, and interview with family, facility staff/caregiver and/or Kimberly Cantu.  I reviewed available labs, medications, imaging, studies and related documents from the EMR.  Records reviewed and summarized above.  Physical Exam: General: frail appearing, thin, debilitated female ENMT: oral mucous  membranes moist MSK: unsteady gait with walker observed Skin: warm and dry Neuro:  + generalized weakness,  + cognitive impairment Psych: non-anxious affect, A and Oriented Thank you for the opportunity to participate in the care of Kimberly Cantu. Please call our office at [phone] if we can be of additional assistance.   Elye Harmsen Prince Rome, NP

## 2022-08-29 ENCOUNTER — Other Ambulatory Visit: Payer: Self-pay

## 2022-08-29 ENCOUNTER — Encounter: Payer: Self-pay | Admitting: Emergency Medicine

## 2022-08-29 ENCOUNTER — Emergency Department
Admission: EM | Admit: 2022-08-29 | Discharge: 2022-08-29 | Disposition: A | Discharge status: Home / Self Care | Payer: Medicare Other | Attending: Emergency Medicine | Admitting: Emergency Medicine

## 2022-08-29 ENCOUNTER — Emergency Department: Payer: Medicare Other

## 2022-08-29 DIAGNOSIS — W19XXXA Unspecified fall, initial encounter: Secondary | ICD-10-CM | POA: Insufficient documentation

## 2022-08-29 DIAGNOSIS — M25561 Pain in right knee: Secondary | ICD-10-CM | POA: Insufficient documentation

## 2022-08-29 DIAGNOSIS — F0390 Unspecified dementia without behavioral disturbance: Secondary | ICD-10-CM | POA: Insufficient documentation

## 2022-08-29 NOTE — ED Triage Notes (Signed)
Pt to ED via [facility] from [facility]. Pt is unsure why she is here. Pt denies pain or any complaints. Per EMS pt fell and pay had hit her head. Pt is poor historian. Pt is not on blood thinners. Facility reported pt has been having knee pain x 2 days but pt denies this.

## 2022-08-29 NOTE — ED Provider Notes (Signed)
[facility] Provider Note    Event Date/Time   First MD Initiated Contact with Patient 08/29/22 1332     (approximate)   History   Fall   HPI  Kimberly Cantu is a 76 y.o. female with dementia under hospice care who comes in with concerns for a fall.  [facility] in [facility].  It patient is a poor historian herself due to dementia.  Patient states that she is not in any pain she denies any injuries.  She denies knowing that she fell or why she fell.  She denies any knee pain.  Patient's husband is at bedside who states that she is acting her normal self.  He states that he wished that they had not brought her in that she is on hospice and her goals of care are comfort.   Physical Exam   Triage Vital Signs: ED Triage Vitals [08/29/22 1221]  Enc Vitals Group     BP (!) 183/79     Pulse Rate 63     Resp 16     Temp 98.2 F (36.8 C)     Temp Source Oral     SpO2 95 %     Weight      Height      Head Circumference      Peak Flow      Pain Score      Pain Loc      Pain Edu?      Excl. in GC?     Most recent vital signs: Vitals:   08/29/22 1221  BP: (!) 183/79  Pulse: 63  Resp: 16  Temp: 98.2 F (36.8 C)  SpO2: 95%     General: Awake, no distress.  CV:  Good peripheral perfusion.  Resp:  Normal effort.  Abd:  No distention.  Other:  Able to lift both legs up off the bed.  No tenderness of the knee.  No obvious hematoma to the head no obvious C-spine tenderness.  No chest wall tenderness full range of motion of arms with good strength no abdomen tenderness.   ED Results / Procedures / Treatments   Labs (all labs ordered are listed, but only abnormal results are displayed) Labs Reviewed - No data to display    RADIOLOGY I have reviewed the CT had personally interpreted no evidence of intracranial hemorrhage  PROCEDURES:  Critical Care performed: No  Procedures   MEDICATIONS ORDERED IN  ED: Medications - No data to display   IMPRESSION / MDM / ASSESSMENT AND PLAN / ED COURSE  I reviewed the triage vital signs and the nursing notes.   Patient's presentation is most consistent with acute presentation with potential threat to life or bodily function.   Differential includes intracranial hemorrhage, cervical fracture, does not seem to have any evidence of hip fracture knee fracture.  She is able to lift both legs up off the bed will do an ambulation trial but no obvious signs of fractures.  Most likely mechanical fall discussed with husband getting blood work to make sure there is nothing else going on and he declined patient is on hospice and their goals of care are comfort.  He is requesting to take patient back to the facility without any further workup.  Some concern from the facility the patient had been having some knee pain but patient is denying it.  She is a little bit of swelling noted of the right knee but no  redness or warmth noted to a good distal pulse.  We are able to stand her up with a walker and she was able to take a few steps she does seem crunched over with walking but husband states that this is her normal.  He denies any concerns with her walking today and feels like this is her normal and declines x-ray or further workup.  He prefer just to take her back to the facility at this time given she appears to be ambulating at her baseline self per husband.     FINAL CLINICAL IMPRESSION(S) / ED DIAGNOSES   Final diagnoses:  Fall, initial encounter     Rx / DC Orders   ED Discharge Orders     None        Note:  This document was prepared using Dragon voice recognition software and may include unintentional dictation errors.   Concha Se, MD 08/29/22 [phone]

## 2022-08-29 NOTE — Discharge Instructions (Addendum)
Patient CT scans of her head and neck were negative.  Her knee did not have any signs for infection and she was able to bear weight and ambulate with a walker that husband stated was at her baseline and so no further imaging was done per family preference given she is on hospice and her goals of care he felt that she was at her baseline self today.

## 2022-08-29 NOTE — ED Triage Notes (Signed)
Pt in via EMS from [facility] on [address]. EMS reports pt with no diagnosis of dementia but EMS reports she rambles when she speaks and is alert and oriented x's 1 and per facility that is her baseline.  Pt with unwitnessed mechanical fall. Pt thinks she hit her head, no LOC and no blood thinners. No obvious injuries. Pt with right knee pain for 2 days and staff wants her knee evaluated because they think that is what made her fall 162/71, HR 97, 95% RA

## 2023-03-13 DEATH — deceased

## 2023-08-26 IMAGING — US US EXTREM LOW VENOUS
1 series · 14 of 24 positions shown · non-contrast
Comparison: None Available.

CLINICAL DATA: Bilateral lower extremity edema, right greater than
left. Evaluate for DVT.



[Series 1: us venous imag bi/left/(id) · portal-venous · 14 of 56 slices shown]
[im 1/56]
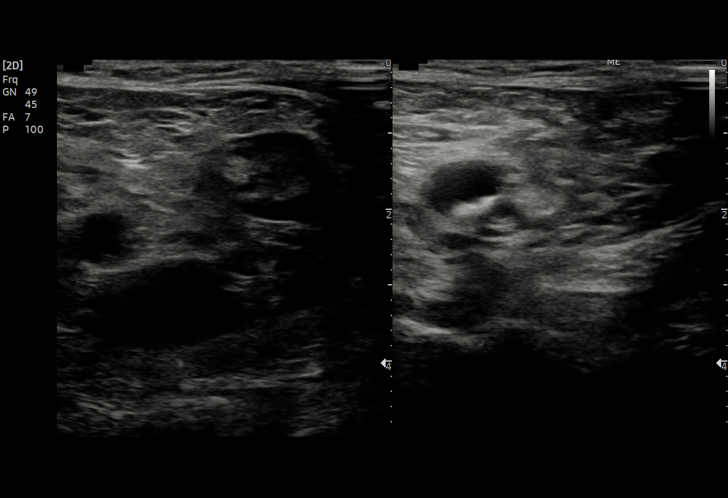
[im 5/56]
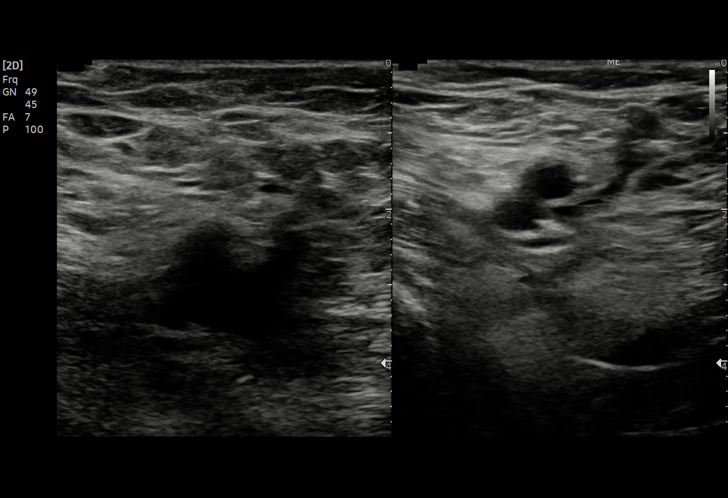
[im 10/56]
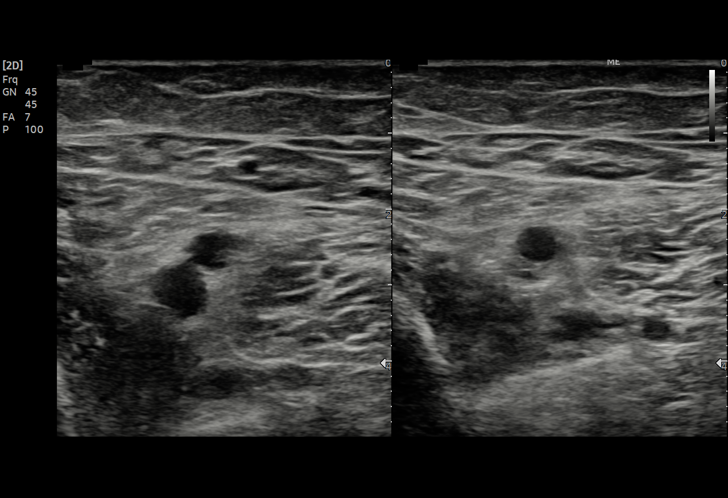
[im 15/56]
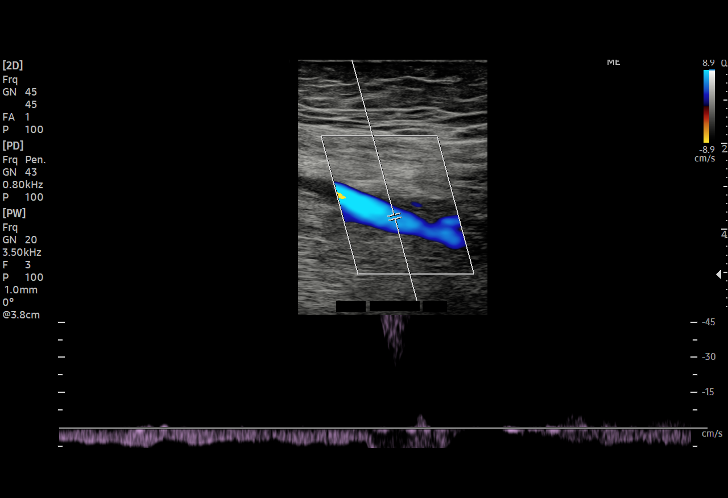
[im 17/56]
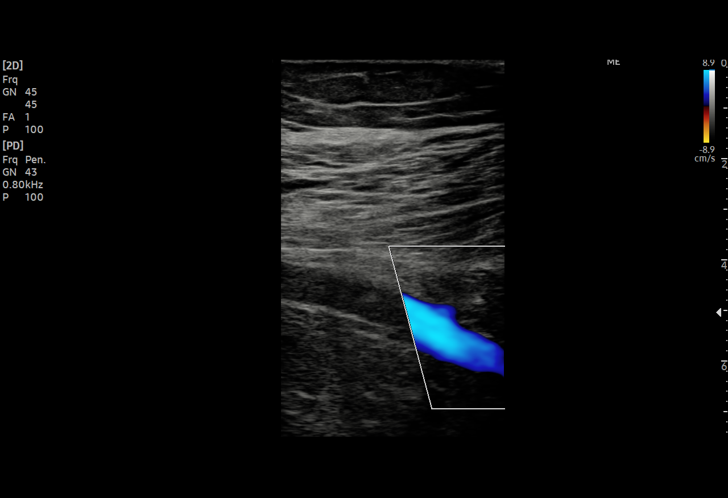
[im 22/56]
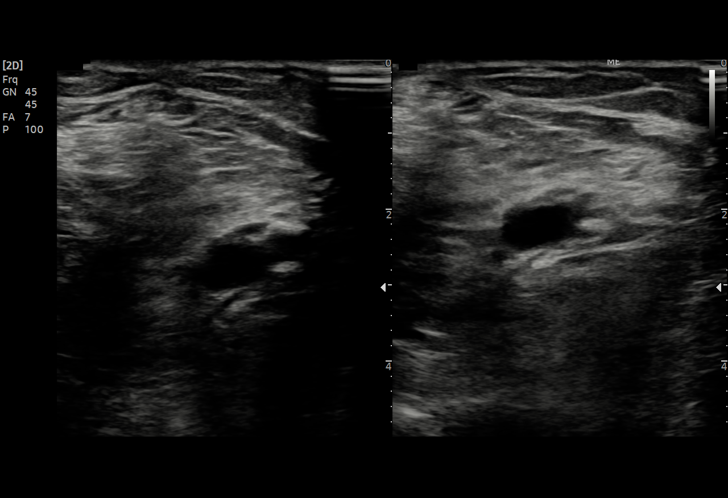
[im 27/56]
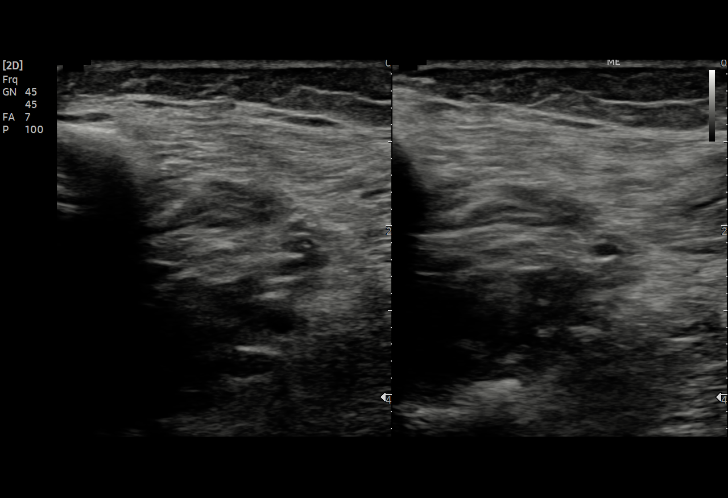
[im 29/56]
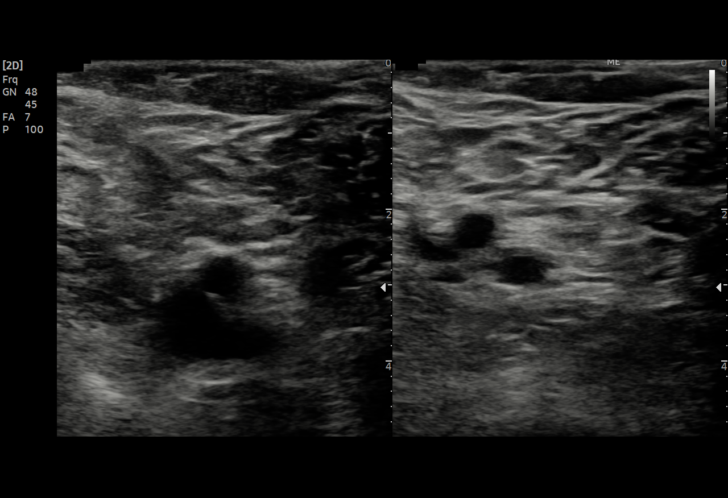
[im 34/56]
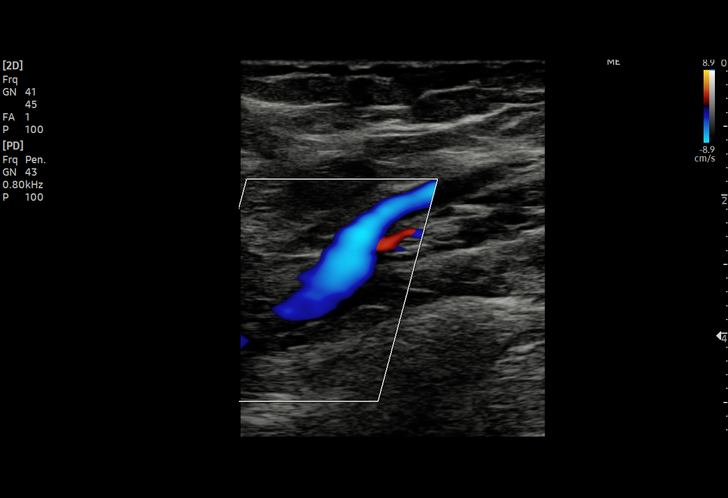
[im 39/56]
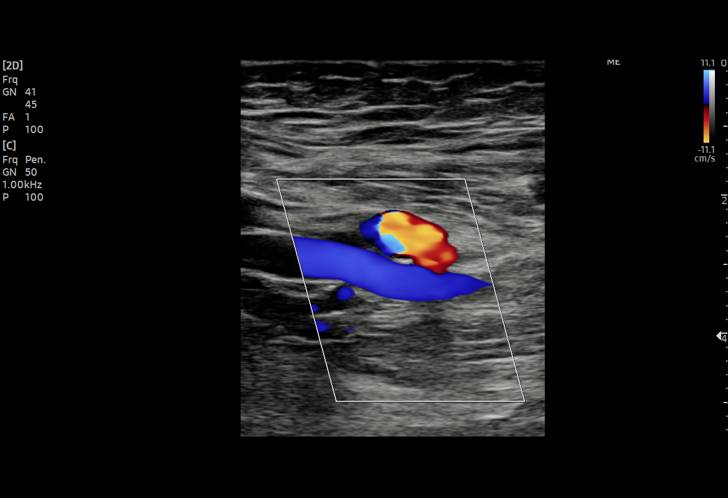
[im 44/56]
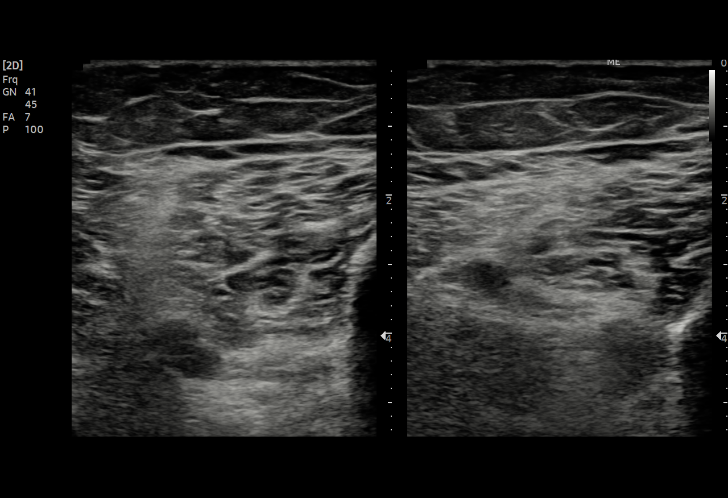
[im 46/56]
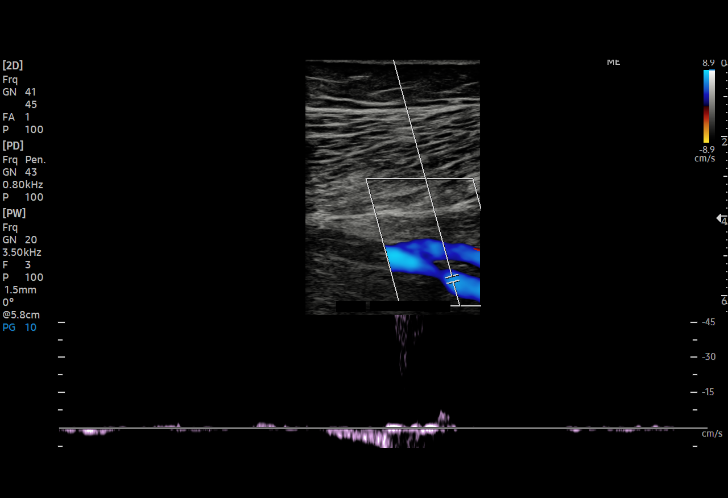
[im 51/56]
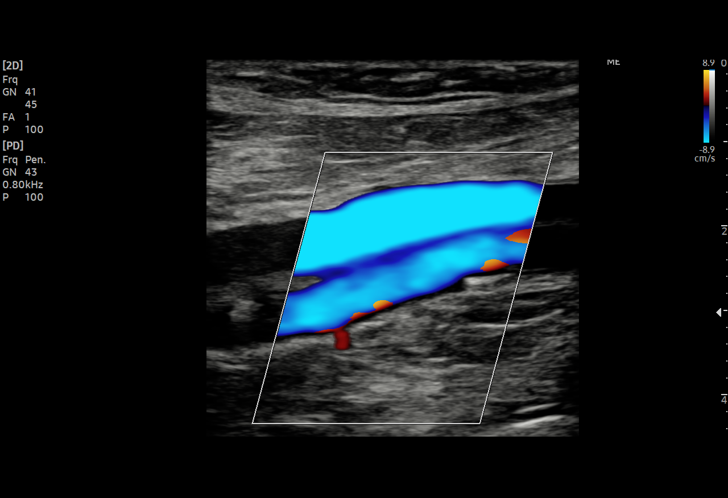
[im 56/56]
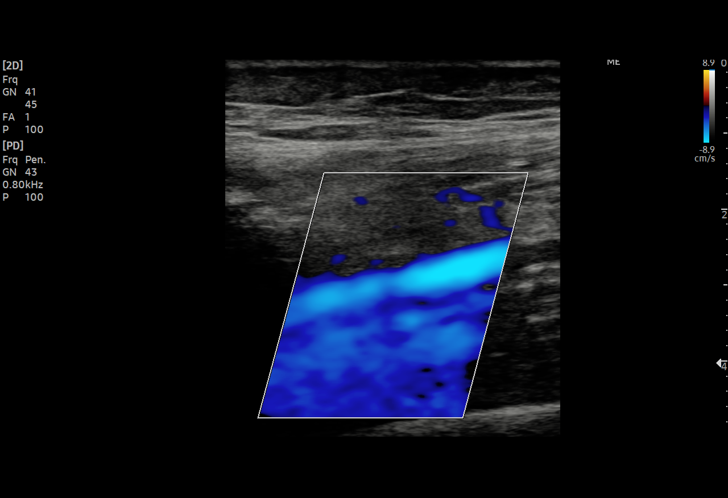

[14 of 24 positions shown; findings below may reference images not displayed]

FINDINGS: RIGHT LOWER EXTREMITY

Common Femoral Vein: No evidence of thrombus. Normal
compressibility, respiratory phasicity and response to augmentation.

Saphenofemoral Junction: No evidence of thrombus. Normal
compressibility and flow on color Doppler imaging.

Profunda Femoral Vein: No evidence of thrombus. Normal
compressibility and flow on color Doppler imaging.

Femoral Vein: No evidence of thrombus. Normal compressibility,
respiratory phasicity and response to augmentation.

Popliteal Vein: No evidence of thrombus. Normal compressibility,
respiratory phasicity and response to augmentation.

Calf Veins: No evidence of thrombus. Normal compressibility and flow
on color Doppler imaging.

Superficial Great Saphenous Vein: No evidence of thrombus. Normal
compressibility.

Venous Reflux:  None.

Other Findings:  None.

LEFT LOWER EXTREMITY

Common Femoral Vein: No evidence of thrombus. Normal
compressibility, respiratory phasicity and response to augmentation.

Saphenofemoral Junction: No evidence of thrombus. Normal
compressibility and flow on color Doppler imaging.

Profunda Femoral Vein: No evidence of thrombus. Normal
compressibility and flow on color Doppler imaging.

Femoral Vein: No evidence of thrombus. Normal compressibility,
respiratory phasicity and response to augmentation.

Popliteal Vein: No evidence of thrombus. Normal compressibility,
respiratory phasicity and response to augmentation.

Calf Veins: No evidence of thrombus. Normal compressibility and flow
on color Doppler imaging.

Superficial Great Saphenous Vein: No evidence of thrombus. Normal
compressibility.

Venous Reflux:  None.

Other Findings:  None.
IMPRESSION: No evidence of DVT within either lower extremity.
